# Patient Record
Sex: Male | Born: 1940 | Hispanic: Yes | Marital: Married | Smoking: Never smoker
Health system: Southern US, Community
[De-identification: ages and names within clinical notes are randomized; demographics above are authoritative.]

## PROBLEM LIST (undated history)

## (undated) DIAGNOSIS — E785 Hyperlipidemia, unspecified: Secondary | ICD-10-CM

## (undated) DIAGNOSIS — E119 Type 2 diabetes mellitus without complications: Secondary | ICD-10-CM

---

## 2018-08-06 ENCOUNTER — Encounter: Payer: Self-pay | Admitting: Cardiovascular Disease

## 2018-08-06 ENCOUNTER — Ambulatory Visit (INDEPENDENT_AMBULATORY_CARE_PROVIDER_SITE_OTHER): Payer: Medicare Other | Admitting: Cardiovascular Disease

## 2018-08-06 VITALS — BP 130/80 | HR 88 | Ht 65.0 in | Wt 186.6 lb

## 2018-08-06 DIAGNOSIS — R0789 Other chest pain: Secondary | ICD-10-CM

## 2018-08-06 DIAGNOSIS — I208 Other forms of angina pectoris: Secondary | ICD-10-CM | POA: Diagnosis not present

## 2018-08-06 DIAGNOSIS — Z01812 Encounter for preprocedural laboratory examination: Secondary | ICD-10-CM | POA: Diagnosis not present

## 2018-08-06 DIAGNOSIS — E119 Type 2 diabetes mellitus without complications: Secondary | ICD-10-CM | POA: Insufficient documentation

## 2018-08-06 DIAGNOSIS — E785 Hyperlipidemia, unspecified: Secondary | ICD-10-CM | POA: Insufficient documentation

## 2018-08-06 DIAGNOSIS — E782 Mixed hyperlipidemia: Secondary | ICD-10-CM | POA: Diagnosis not present

## 2018-08-06 MED ORDER — METOPROLOL TARTRATE 50 MG PO TABS: 50.0000 mg | ORAL_TABLET | Freq: Two times a day (BID) | ORAL | 0 refills | Status: DC

## 2018-08-06 NOTE — Assessment & Plan Note (Signed)
[name] was referred by Dr. Hanley Hays for evaluation of atypical chest pain.  His risk factors include treated hyperlipidemia and diabetes.  There is no family history.  He is never smoked.  He did have a normal 2D echo and Myoview stress test back in October Performa Dr. Hanley Hays.  He has ongoing chest pain which is nitrate responsive.  He was sent here for further evaluation.  I think we can perform coronary CTA and this is normal not proceed with an invasive cath however if this is abnormal we talked about proceeding with cardiac catheterization.

## 2018-08-06 NOTE — Patient Instructions (Addendum)
Please arrive at the [facility] main entrance of [facility] at xx:xx AM (30-45 minutes prior to test start time)  [facility] [address] [place], [place] [address] [phone]  Proceed to the [facility] Radiology Department (First Floor).  Please follow these instructions carefully (unless otherwise directed):  Hold all erectile dysfunction medications at least 48 hours prior to test.  On the Night Before the Test: . Be sure to Drink plenty of water. . Do not consume any caffeinated/decaffeinated beverages or chocolate 12 hours prior to your test. . Do not take any antihistamines 12 hours prior to your test. . If you take Metformin do not take 24 hours prior to test.  On the Day of the Test: . Drink plenty of water. Do not drink any water within one hour of the test. . Do not eat any food 4 hours prior to the test. . You may take your regular medications prior to the test.  . Take metoprolol 50 mg (Lopressor) two hours prior to test.                 -If HR is greater than 55 BPM and patient is greater than 81 yrs old Lopressor 50 mg x1.        After the Test: . Drink plenty of water. . After receiving IV contrast, you may experience a mild flushed feeling. This is normal. . On occasion, you may experience a mild rash up to 24 hours after the test. This is not dangerous. If this occurs, you can take Benadryl 25 mg and increase your fluid intake. . If you experience trouble breathing, this can be serious. If it is severe call 911 IMMEDIATELY. If it is mild, please call our office. . If you take any of these medications: Glipizide/Metformin, Avandament, Glucavance, please do not take 48 hours after completing test.

## 2018-08-06 NOTE — Progress Notes (Signed)
08/06/2018 Hunter Harvey   September 07, 1940  [id number]  Primary Physician Hunter Dodge, MD Primary Cardiologist: Hunter Gess MD Hunter Harvey, [place]  HPI:  Hunter Harvey is a 78 y.o. moderately overweight married Latino male father 3, grandfather 2 grandchildren who is accompanied by his daughter-in-law Hunter Harvey and an interpreter.  He was referred by Dr. Hanley Harvey for cardiovascular evaluation because of atypical chest pain.  He lived in [place] for 30 years and moved to [place] 2 years ago for family reasons.  His risk factors include treated hyperlipidemia and diabetes.  He is never had a heart attack or stroke.  There is no family history for heart disease.  He did have a normal 2D echo and Myoview stress test back in October but continues to have chest pain which is nitrate responsive.   Current Meds  Medication Sig  . Insulin Glargine, 1 Unit Dial, 300 UNIT/ML SOPN Inject into the skin.     Not on File  Social History   Socioeconomic History  . Marital status: Married    Spouse name: Not on file  . Number of children: Not on file  . Years of education: Not on file  . Highest education level: Not on file  Occupational History  . Not on file  Social Needs  . Financial resource strain: Not on file  . Food insecurity:    Worry: Not on file    Inability: Not on file  . Transportation needs:    Medical: Not on file    Non-medical: Not on file  Tobacco Use  . Smoking status: Never Smoker  . Smokeless tobacco: Never Used  Substance and Sexual Activity  . Alcohol use: Not on file  . Drug use: Not on file  . Sexual activity: Not on file  Lifestyle  . Physical activity:    Days per week: Not on file    Minutes per session: Not on file  . Stress: Not on file  Relationships  . Social connections:    Talks on phone: Not on file    Gets together: Not on file    Attends religious service: Not on file    Active member of club or organization: Not on  file    Attends meetings of clubs or organizations: Not on file    Relationship status: Not on file  . Intimate partner violence:    Fear of current or ex partner: Not on file    Emotionally abused: Not on file    Physically abused: Not on file    Forced sexual activity: Not on file  Other Topics Concern  . Not on file  Social History Narrative  . Not on file     Review of Systems: General: negative for chills, fever, night sweats or weight changes.  Cardiovascular: negative for chest pain, dyspnea on exertion, edema, orthopnea, palpitations, paroxysmal nocturnal dyspnea or shortness of breath Dermatological: negative for rash Respiratory: negative for cough or wheezing Urologic: negative for hematuria Abdominal: negative for nausea, vomiting, diarrhea, bright red blood per rectum, melena, or hematemesis Neurologic: negative for visual changes, syncope, or dizziness All other systems reviewed and are otherwise negative except as noted above.    Blood pressure 130/80, pulse 88, height 5\' 5"  (1.651 m), weight 186 lb 9.6 oz (84.6 kg).  General appearance: alert and no distress Neck: no adenopathy, no carotid bruit, no JVD, supple, symmetrical, trachea midline and thyroid not enlarged, symmetric, no tenderness/mass/nodules Lungs: clear  to auscultation bilaterally Heart: regular rate and rhythm, S1, S2 normal, no murmur, click, rub or gallop Extremities: extremities normal, atraumatic, no cyanosis or edema Pulses: 2+ and symmetric Skin: Skin color, texture, turgor normal. No rashes or lesions Neurologic: Alert and oriented X 3, normal strength and tone. Normal symmetric reflexes. 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  I personally reviewed this EKG.  ASSESSMENT AND PLAN:   Atypical chest pain [name] was referred by Dr. Hanley Harvey for evaluation of atypical chest pain.  His  risk factors include treated hyperlipidemia and diabetes.  There is no family history.  He is never smoked.  He did have a normal 2D echo and Myoview stress test back in October Performa Dr. Hanley Harvey.  He has ongoing chest pain which is nitrate responsive.  He was sent here for further evaluation.  I think we can perform coronary CTA and this is normal not proceed with an invasive cath however if this is abnormal we talked about proceeding with cardiac catheterization.      Hunter Gess MD FACP,FACC,FAHA, [facility] 08/06/2018 10:26 AM

## 2018-09-01 ENCOUNTER — Ambulatory Visit: Payer: Medicare Other | Admitting: Cardiovascular Disease

## 2018-10-18 ENCOUNTER — Other Ambulatory Visit: Payer: Self-pay | Admitting: Cardiovascular Disease

## 2018-10-20 ENCOUNTER — Telehealth: Payer: Self-pay | Admitting: Cardiovascular Disease

## 2018-10-20 NOTE — Telephone Encounter (Signed)
Left message through  the interpreter to call to and schedule cardaic ct

## 2018-11-14 ENCOUNTER — Other Ambulatory Visit: Payer: Self-pay

## 2018-11-14 MED ORDER — METOPROLOL TARTRATE 50 MG PO TABS: 50.0000 mg | ORAL_TABLET | Freq: Once | ORAL | 0 refills | Status: AC

## 2018-11-14 MED ORDER — METOPROLOL TARTRATE 50 MG PO TABS: 50.0000 mg | ORAL_TABLET | Freq: Once | ORAL | 0 refills | Status: DC

## 2018-12-01 NOTE — Addendum Note (Signed)
Addended by: Annita Brod on: 12/01/2018 06:01 PM   Modules accepted: Orders

## 2018-12-15 ENCOUNTER — Telehealth: Payer: Self-pay

## 2018-12-15 NOTE — Telephone Encounter (Signed)
[facility]MM on [phone] and [phone] regarding pt need for lab work on 7/16 prior to coronary CTA on 7/17

## 2018-12-15 NOTE — Addendum Note (Signed)
Addended by: Annita Brod on: 12/15/2018 05:18 PM   Modules accepted: Orders

## 2018-12-16 NOTE — Telephone Encounter (Signed)
New message:     Patient daughter-in-law calling concering patient up coming tomorrow they do not want this appt. Please call patient.

## 2018-12-16 NOTE — Telephone Encounter (Signed)
LM2CB x2  

## 2018-12-17 ENCOUNTER — Ambulatory Visit (HOSPITAL_COMMUNITY): Payer: Medicare Other

## 2018-12-17 NOTE — Telephone Encounter (Signed)
Patient ct scan has been canceled.

## 2019-10-31 ENCOUNTER — Emergency Department (HOSPITAL_COMMUNITY): Payer: Medicare Other

## 2019-10-31 ENCOUNTER — Other Ambulatory Visit: Payer: Self-pay

## 2019-10-31 ENCOUNTER — Inpatient Hospital Stay (HOSPITAL_COMMUNITY)
Admission: EM | Admit: 2019-10-31 | Discharge: 2019-11-05 | DRG: 208 | Disposition: A | Discharge status: Home / Self Care | Payer: Medicare Other | Attending: Pulmonary Disease | Admitting: Pulmonary Disease

## 2019-10-31 ENCOUNTER — Inpatient Hospital Stay (HOSPITAL_COMMUNITY): Payer: Medicare Other

## 2019-10-31 ENCOUNTER — Encounter (HOSPITAL_COMMUNITY): Payer: Self-pay | Admitting: Critical Care Medicine

## 2019-10-31 DIAGNOSIS — J9600 Acute respiratory failure, unspecified whether with hypoxia or hypercapnia: Secondary | ICD-10-CM | POA: Diagnosis present

## 2019-10-31 DIAGNOSIS — J8489 Other specified interstitial pulmonary diseases: Secondary | ICD-10-CM | POA: Diagnosis present

## 2019-10-31 DIAGNOSIS — R34 Anuria and oliguria: Secondary | ICD-10-CM | POA: Diagnosis not present

## 2019-10-31 DIAGNOSIS — J8410 Pulmonary fibrosis, unspecified: Secondary | ICD-10-CM | POA: Diagnosis present

## 2019-10-31 DIAGNOSIS — J9621 Acute and chronic respiratory failure with hypoxia: Principal | ICD-10-CM | POA: Diagnosis present

## 2019-10-31 DIAGNOSIS — E11649 Type 2 diabetes mellitus with hypoglycemia without coma: Secondary | ICD-10-CM | POA: Diagnosis not present

## 2019-10-31 DIAGNOSIS — Z7982 Long term (current) use of aspirin: Secondary | ICD-10-CM

## 2019-10-31 DIAGNOSIS — N179 Acute kidney failure, unspecified: Secondary | ICD-10-CM | POA: Diagnosis present

## 2019-10-31 DIAGNOSIS — J9622 Acute and chronic respiratory failure with hypercapnia: Secondary | ICD-10-CM | POA: Diagnosis present

## 2019-10-31 DIAGNOSIS — E11319 Type 2 diabetes mellitus with unspecified diabetic retinopathy without macular edema: Secondary | ICD-10-CM | POA: Diagnosis present

## 2019-10-31 DIAGNOSIS — F419 Anxiety disorder, unspecified: Secondary | ICD-10-CM | POA: Diagnosis not present

## 2019-10-31 DIAGNOSIS — Z515 Encounter for palliative care: Secondary | ICD-10-CM

## 2019-10-31 DIAGNOSIS — M069 Rheumatoid arthritis, unspecified: Secondary | ICD-10-CM | POA: Diagnosis present

## 2019-10-31 DIAGNOSIS — Z794 Long term (current) use of insulin: Secondary | ICD-10-CM

## 2019-10-31 DIAGNOSIS — J9602 Acute respiratory failure with hypercapnia: Secondary | ICD-10-CM

## 2019-10-31 DIAGNOSIS — Z66 Do not resuscitate: Secondary | ICD-10-CM | POA: Diagnosis present

## 2019-10-31 DIAGNOSIS — D539 Nutritional anemia, unspecified: Secondary | ICD-10-CM | POA: Diagnosis present

## 2019-10-31 DIAGNOSIS — E872 Acidosis: Secondary | ICD-10-CM | POA: Diagnosis present

## 2019-10-31 DIAGNOSIS — E1121 Type 2 diabetes mellitus with diabetic nephropathy: Secondary | ICD-10-CM | POA: Diagnosis present

## 2019-10-31 DIAGNOSIS — J189 Pneumonia, unspecified organism: Secondary | ICD-10-CM | POA: Diagnosis present

## 2019-10-31 DIAGNOSIS — E785 Hyperlipidemia, unspecified: Secondary | ICD-10-CM | POA: Diagnosis present

## 2019-10-31 DIAGNOSIS — J811 Chronic pulmonary edema: Secondary | ICD-10-CM | POA: Diagnosis present

## 2019-10-31 DIAGNOSIS — G9341 Metabolic encephalopathy: Secondary | ICD-10-CM | POA: Diagnosis present

## 2019-10-31 DIAGNOSIS — Z20822 Contact with and (suspected) exposure to covid-19: Secondary | ICD-10-CM | POA: Diagnosis present

## 2019-10-31 DIAGNOSIS — I959 Hypotension, unspecified: Secondary | ICD-10-CM | POA: Diagnosis not present

## 2019-10-31 DIAGNOSIS — R7402 Elevation of levels of lactic acid dehydrogenase (LDH): Secondary | ICD-10-CM | POA: Diagnosis present

## 2019-10-31 DIAGNOSIS — E875 Hyperkalemia: Secondary | ICD-10-CM | POA: Diagnosis present

## 2019-10-31 DIAGNOSIS — H903 Sensorineural hearing loss, bilateral: Secondary | ICD-10-CM | POA: Diagnosis present

## 2019-10-31 DIAGNOSIS — Z79899 Other long term (current) drug therapy: Secondary | ICD-10-CM

## 2019-10-31 DIAGNOSIS — J849 Interstitial pulmonary disease, unspecified: Secondary | ICD-10-CM

## 2019-10-31 DIAGNOSIS — Z9981 Dependence on supplemental oxygen: Secondary | ICD-10-CM

## 2019-10-31 DIAGNOSIS — E871 Hypo-osmolality and hyponatremia: Secondary | ICD-10-CM | POA: Diagnosis present

## 2019-10-31 HISTORY — DX: Hyperlipidemia, unspecified: E78.5

## 2019-10-31 HISTORY — DX: Type 2 diabetes mellitus without complications: E11.9

## 2019-10-31 LAB — COMPREHENSIVE METABOLIC PANEL
ALT: 17 U/L (ref 0–44)
AST: 25 U/L (ref 15–41)
Albumin: 2.9 g/dL — ABNORMAL LOW (ref 3.5–5.0)
Alkaline Phosphatase: 86 U/L (ref 38–126)
Anion gap: 9 (ref 5–15)
BUN: 37 mg/dL — ABNORMAL HIGH (ref 8–23)
CO2: 30 mmol/L (ref 22–32)
Calcium: 8.2 mg/dL — ABNORMAL LOW (ref 8.9–10.3)
Chloride: 86 mmol/L — ABNORMAL LOW (ref 98–111)
Creatinine, Ser: 1.5 mg/dL — ABNORMAL HIGH (ref 0.61–1.24)
GFR calc Af Amer: 51 mL/min — ABNORMAL LOW (ref >60)
GFR calc non Af Amer: 44 mL/min — ABNORMAL LOW (ref >60)
Glucose, Bld: 221 mg/dL — ABNORMAL HIGH (ref 70–99)
Potassium: >7.5 mmol/L (ref 3.5–5.1)
Sodium: 125 mmol/L — ABNORMAL LOW (ref 135–145)
Total Bilirubin: 0.3 mg/dL (ref 0.3–1.2)
Total Protein: 7.8 g/dL (ref 6.5–8.1)

## 2019-10-31 LAB — CBC WITH DIFFERENTIAL/PLATELET
Abs Immature Granulocytes: 0.2 10*3/uL — ABNORMAL HIGH (ref 0.00–0.07)
Basophils Absolute: 0 10*3/uL (ref 0.0–0.1)
Basophils Relative: 0 %
Eosinophils Absolute: 0 10*3/uL (ref 0.0–0.5)
Eosinophils Relative: 0 %
HCT: 34.5 % — ABNORMAL LOW (ref 39.0–52.0)
Hemoglobin: 10.7 g/dL — ABNORMAL LOW (ref 13.0–17.0)
Immature Granulocytes: 2 %
Lymphocytes Relative: 8 %
Lymphs Abs: 1.1 10*3/uL (ref 0.7–4.0)
MCH: 29.8 pg (ref 26.0–34.0)
MCHC: 31 g/dL (ref 30.0–36.0)
MCV: 96.1 fL (ref 80.0–100.0)
Monocytes Absolute: 1.1 10*3/uL — ABNORMAL HIGH (ref 0.1–1.0)
Monocytes Relative: 8 %
Neutro Abs: 11 10*3/uL — ABNORMAL HIGH (ref 1.7–7.7)
Neutrophils Relative %: 82 %
Platelets: 275 10*3/uL (ref 150–400)
RBC: 3.59 MIL/uL — ABNORMAL LOW (ref 4.22–5.81)
RDW: 14 % (ref 11.5–15.5)
WBC: 13.5 10*3/uL — ABNORMAL HIGH (ref 4.0–10.5)
nRBC: 0 % (ref 0.0–0.2)

## 2019-10-31 LAB — BASIC METABOLIC PANEL
Anion gap: 4 — ABNORMAL LOW (ref 5–15)
Anion gap: 8 (ref 5–15)
BUN: 40 mg/dL — ABNORMAL HIGH (ref 8–23)
BUN: 41 mg/dL — ABNORMAL HIGH (ref 8–23)
CO2: 31 mmol/L (ref 22–32)
CO2: 28 mmol/L (ref 22–32)
Calcium: 8 mg/dL — ABNORMAL LOW (ref 8.9–10.3)
Calcium: 8 mg/dL — ABNORMAL LOW (ref 8.9–10.3)
Chloride: 90 mmol/L — ABNORMAL LOW (ref 98–111)
Chloride: 89 mmol/L — ABNORMAL LOW (ref 98–111)
Creatinine, Ser: 1.42 mg/dL — ABNORMAL HIGH (ref 0.61–1.24)
Creatinine, Ser: 1.5 mg/dL — ABNORMAL HIGH (ref 0.61–1.24)
GFR calc Af Amer: 54 mL/min — ABNORMAL LOW (ref >60)
GFR calc Af Amer: 51 mL/min — ABNORMAL LOW (ref >60)
GFR calc non Af Amer: 47 mL/min — ABNORMAL LOW (ref >60)
GFR calc non Af Amer: 44 mL/min — ABNORMAL LOW (ref >60)
Glucose, Bld: 275 mg/dL — ABNORMAL HIGH (ref 70–99)
Glucose, Bld: 264 mg/dL — ABNORMAL HIGH (ref 70–99)
Potassium: 5.5 mmol/L — ABNORMAL HIGH (ref 3.5–5.1)
Potassium: 5.6 mmol/L — ABNORMAL HIGH (ref 3.5–5.1)
Sodium: 125 mmol/L — ABNORMAL LOW (ref 135–145)
Sodium: 125 mmol/L — ABNORMAL LOW (ref 135–145)

## 2019-10-31 LAB — POCT I-STAT EG7
Acid-Base Excess: 4 mmol/L — ABNORMAL HIGH (ref 0.0–2.0)
Bicarbonate: 34.8 mmol/L — ABNORMAL HIGH (ref 20.0–28.0)
Calcium, Ion: 1.1 mmol/L — ABNORMAL LOW (ref 1.15–1.40)
HCT: 34 % — ABNORMAL LOW (ref 39.0–52.0)
Hemoglobin: 11.6 g/dL — ABNORMAL LOW (ref 13.0–17.0)
O2 Saturation: 96 %
Potassium: 5.7 mmol/L — ABNORMAL HIGH (ref 3.5–5.1)
Sodium: 127 mmol/L — ABNORMAL LOW (ref 135–145)
TCO2: 38 mmol/L — ABNORMAL HIGH (ref 22–32)
pCO2, Ven: 92.5 mmHg (ref 44.0–60.0)
pH, Ven: 7.183 — CL (ref 7.250–7.430)
pO2, Ven: 107 mmHg — ABNORMAL HIGH (ref 32.0–45.0)

## 2019-10-31 LAB — CBG MONITORING, ED: Glucose-Capillary: 228 mg/dL — ABNORMAL HIGH (ref 70–99)

## 2019-10-31 LAB — CBC
HCT: 31.6 % — ABNORMAL LOW (ref 39.0–52.0)
Hemoglobin: 9.9 g/dL — ABNORMAL LOW (ref 13.0–17.0)
MCH: 30.3 pg (ref 26.0–34.0)
MCHC: 31.3 g/dL (ref 30.0–36.0)
MCV: 96.6 fL (ref 80.0–100.0)
Platelets: 246 10*3/uL (ref 150–400)
RBC: 3.27 MIL/uL — ABNORMAL LOW (ref 4.22–5.81)
RDW: 14 % (ref 11.5–15.5)
WBC: 9.8 10*3/uL (ref 4.0–10.5)
nRBC: 0 % (ref 0.0–0.2)

## 2019-10-31 LAB — CREATININE, SERUM
Creatinine, Ser: 1.49 mg/dL — ABNORMAL HIGH (ref 0.61–1.24)
GFR calc Af Amer: 51 mL/min — ABNORMAL LOW (ref >60)
GFR calc non Af Amer: 44 mL/min — ABNORMAL LOW (ref >60)

## 2019-10-31 LAB — POCT I-STAT 7, (LYTES, BLD GAS, ICA,H+H)
Acid-Base Excess: 4 mmol/L — ABNORMAL HIGH (ref 0.0–2.0)
Acid-Base Excess: 8 mmol/L — ABNORMAL HIGH (ref 0.0–2.0)
Bicarbonate: 34.7 mmol/L — ABNORMAL HIGH (ref 20.0–28.0)
Bicarbonate: 35.8 mmol/L — ABNORMAL HIGH (ref 20.0–28.0)
Calcium, Ion: 1.18 mmol/L (ref 1.15–1.40)
Calcium, Ion: 1.14 mmol/L — ABNORMAL LOW (ref 1.15–1.40)
HCT: 35 % — ABNORMAL LOW (ref 39.0–52.0)
HCT: 31 % — ABNORMAL LOW (ref 39.0–52.0)
Hemoglobin: 11.9 g/dL — ABNORMAL LOW (ref 13.0–17.0)
Hemoglobin: 10.5 g/dL — ABNORMAL LOW (ref 13.0–17.0)
O2 Saturation: 91 %
O2 Saturation: 100 %
Patient temperature: 98
Patient temperature: 98
Potassium: 6.3 mmol/L (ref 3.5–5.1)
Potassium: 5.5 mmol/L — ABNORMAL HIGH (ref 3.5–5.1)
Sodium: 124 mmol/L — ABNORMAL LOW (ref 135–145)
Sodium: 126 mmol/L — ABNORMAL LOW (ref 135–145)
TCO2: 37 mmol/L — ABNORMAL HIGH (ref 22–32)
TCO2: 38 mmol/L — ABNORMAL HIGH (ref 22–32)
pCO2 arterial: 89.2 mmHg (ref 32.0–48.0)
pCO2 arterial: 68.7 mmHg (ref 32.0–48.0)
pH, Arterial: 7.196 — CL (ref 7.350–7.450)
pH, Arterial: 7.323 — ABNORMAL LOW (ref 7.350–7.450)
pO2, Arterial: 78 mmHg — ABNORMAL LOW (ref 83.0–108.0)
pO2, Arterial: 507 mmHg — ABNORMAL HIGH (ref 83.0–108.0)

## 2019-10-31 LAB — HEMOGLOBIN A1C
Hgb A1c MFr Bld: 7.9 % — ABNORMAL HIGH (ref 4.8–5.6)
Mean Plasma Glucose: 180.03 mg/dL

## 2019-10-31 LAB — RETICULOCYTES
Immature Retic Fract: 29.8 % — ABNORMAL HIGH (ref 2.3–15.9)
RBC.: 3.28 MIL/uL — ABNORMAL LOW (ref 4.22–5.81)
Retic Count, Absolute: 100.4 10*3/uL (ref 19.0–186.0)
Retic Ct Pct: 3.1 % (ref 0.4–3.1)

## 2019-10-31 LAB — LACTATE DEHYDROGENASE: LDH: 205 U/L — ABNORMAL HIGH (ref 98–192)

## 2019-10-31 LAB — IRON AND TIBC
Iron: 35 ug/dL — ABNORMAL LOW (ref 45–182)
Saturation Ratios: 13 % — ABNORMAL LOW (ref 17.9–39.5)
TIBC: 265 ug/dL (ref 250–450)
UIBC: 230 ug/dL

## 2019-10-31 LAB — FERRITIN: Ferritin: 111 ng/mL (ref 24–336)

## 2019-10-31 LAB — BILIRUBIN, TOTAL: Total Bilirubin: 1.1 mg/dL (ref 0.3–1.2)

## 2019-10-31 LAB — BILIRUBIN, DIRECT: Bilirubin, Direct: 0.3 mg/dL — ABNORMAL HIGH (ref 0.0–0.2)

## 2019-10-31 LAB — BRAIN NATRIURETIC PEPTIDE: B Natriuretic Peptide: 311.9 pg/mL — ABNORMAL HIGH (ref 0.0–100.0)

## 2019-10-31 LAB — FOLATE: Folate: 9 ng/mL (ref >5.9)

## 2019-10-31 LAB — VITAMIN B12: Vitamin B-12: 208 pg/mL (ref 180–914)

## 2019-10-31 LAB — TROPONIN I (HIGH SENSITIVITY)
Troponin I (High Sensitivity): 10 ng/L (ref <18)
Troponin I (High Sensitivity): 10 ng/L (ref <18)

## 2019-10-31 LAB — ETHANOL: Alcohol, Ethyl (B): <10 mg/dL (ref <10)

## 2019-10-31 LAB — SARS CORONAVIRUS 2 BY RT PCR (HOSPITAL ORDER, PERFORMED IN ~~LOC~~ HOSPITAL LAB): SARS Coronavirus 2: NEGATIVE

## 2019-10-31 LAB — POC SARS CORONAVIRUS 2 AG -  ED: SARS Coronavirus 2 Ag: NEGATIVE

## 2019-10-31 MED ORDER — SODIUM BICARBONATE 8.4 % IV SOLN
50.0000 meq | Freq: Once | INTRAVENOUS | Status: AC
  Administered 2019-10-31: 50 meq via INTRAVENOUS
  Filled 2019-10-31: qty 50

## 2019-10-31 MED ORDER — FENTANYL CITRATE (PF) 100 MCG/2ML IJ SOLN: 25.0000 ug | INTRAMUSCULAR | Status: DC | PRN

## 2019-10-31 MED ORDER — IPRATROPIUM-ALBUTEROL 0.5-2.5 (3) MG/3ML IN SOLN
3.0000 mL | Freq: Once | RESPIRATORY_TRACT | Status: AC
  Administered 2019-10-31: 3 mL via RESPIRATORY_TRACT

## 2019-10-31 MED ORDER — PANTOPRAZOLE SODIUM 40 MG IV SOLR
40.0000 mg | Freq: Every day | INTRAVENOUS | Status: DC
  Administered 2019-11-01 – 2019-11-02 (×2): 40 mg via INTRAVENOUS
  Filled 2019-10-31 (×2): qty 40

## 2019-10-31 MED ORDER — CALCIUM GLUCONATE 10 % IV SOLN
1.0000 g | Freq: Once | INTRAVENOUS | Status: AC
  Administered 2019-10-31: 1 g via INTRAVENOUS
  Filled 2019-10-31: qty 10

## 2019-10-31 MED ORDER — SODIUM CHLORIDE 0.9 % IV SOLN
500.0000 mg | Freq: Once | INTRAVENOUS | Status: AC
  Administered 2019-10-31: 500 mg via INTRAVENOUS
  Filled 2019-10-31: qty 500

## 2019-10-31 MED ORDER — IPRATROPIUM-ALBUTEROL 0.5-2.5 (3) MG/3ML IN SOLN
RESPIRATORY_TRACT | Status: AC
  Filled 2019-10-31: qty 3

## 2019-10-31 MED ORDER — SODIUM CHLORIDE 0.9 % IV SOLN
1.0000 g | Freq: Once | INTRAVENOUS | Status: AC
  Administered 2019-10-31: 1 g via INTRAVENOUS
  Filled 2019-10-31: qty 10

## 2019-10-31 MED ORDER — PROPOFOL 1000 MG/100ML IV EMUL
0.0000 ug/kg/min | INTRAVENOUS | Status: DC
  Administered 2019-10-31: 20.425 ug/kg/min via INTRAVENOUS
  Administered 2019-11-01: 35 ug/kg/min via INTRAVENOUS
  Administered 2019-11-01: 45 ug/kg/min via INTRAVENOUS
  Administered 2019-11-01: 10 ug/kg/min via INTRAVENOUS
  Administered 2019-11-02: 15 ug/kg/min via INTRAVENOUS
  Filled 2019-10-31 (×3): qty 100
  Filled 2019-10-31: qty 200

## 2019-10-31 MED ORDER — ETOMIDATE 2 MG/ML IV SOLN: 20.0000 mg | Freq: Once | INTRAVENOUS | Status: DC

## 2019-10-31 MED ORDER — INSULIN ASPART 100 UNIT/ML IV SOLN
10.0000 [IU] | Freq: Once | INTRAVENOUS | Status: AC
  Administered 2019-10-31: 10 [IU] via INTRAVENOUS

## 2019-10-31 MED ORDER — HEPARIN SODIUM (PORCINE) 5000 UNIT/ML IJ SOLN
5000.0000 [IU] | Freq: Three times a day (TID) | INTRAMUSCULAR | Status: DC
  Administered 2019-11-01 – 2019-11-05 (×14): 5000 [IU] via SUBCUTANEOUS
  Filled 2019-10-31 (×14): qty 1

## 2019-10-31 MED ORDER — LORAZEPAM 2 MG/ML IJ SOLN
0.5000 mg | Freq: Once | INTRAMUSCULAR | Status: AC
  Administered 2019-10-31: 0.5 mg via INTRAVENOUS
  Filled 2019-10-31: qty 1

## 2019-10-31 MED ORDER — SODIUM CHLORIDE 0.9 % IV SOLN
2.0000 g | INTRAVENOUS | Status: DC
  Administered 2019-11-01 – 2019-11-04 (×4): 2 g via INTRAVENOUS
  Filled 2019-10-31 (×2): qty 2
  Filled 2019-10-31: qty 20
  Filled 2019-10-31 (×2): qty 2

## 2019-10-31 MED ORDER — ALBUTEROL (5 MG/ML) CONTINUOUS INHALATION SOLN
15.0000 mg/h | INHALATION_SOLUTION | RESPIRATORY_TRACT | Status: DC
  Administered 2019-10-31: 15 mg/h via RESPIRATORY_TRACT
  Filled 2019-10-31 (×2): qty 20

## 2019-10-31 MED ORDER — SODIUM CHLORIDE 0.9 % IV SOLN
500.0000 mg | INTRAVENOUS | Status: DC
  Administered 2019-11-01 – 2019-11-04 (×4): 500 mg via INTRAVENOUS
  Filled 2019-10-31 (×4): qty 500

## 2019-10-31 MED ORDER — ROCURONIUM BROMIDE 50 MG/5ML IV SOLN
INTRAVENOUS | Status: AC | PRN
  Administered 2019-10-31: 100 mg via INTRAVENOUS

## 2019-10-31 MED ORDER — POLYETHYLENE GLYCOL 3350 17 G PO PACK
17.0000 g | PACK | Freq: Every day | ORAL | Status: DC
  Filled 2019-10-31: qty 1

## 2019-10-31 MED ORDER — ROCURONIUM BROMIDE 50 MG/5ML IV SOLN
80.0000 mg | Freq: Once | INTRAVENOUS | Status: DC
  Filled 2019-10-31: qty 8

## 2019-10-31 MED ORDER — DOCUSATE SODIUM 50 MG/5ML PO LIQD
100.0000 mg | Freq: Two times a day (BID) | ORAL | Status: DC
  Filled 2019-10-31 (×2): qty 10

## 2019-10-31 MED ORDER — ETOMIDATE 2 MG/ML IV SOLN
INTRAVENOUS | Status: AC | PRN
  Administered 2019-10-31: 20 mg via INTRAVENOUS

## 2019-10-31 MED ORDER — METHYLPREDNISOLONE SODIUM SUCC 125 MG IJ SOLR
125.0000 mg | Freq: Once | INTRAMUSCULAR | Status: AC
  Administered 2019-10-31: 125 mg via INTRAVENOUS
  Filled 2019-10-31: qty 2

## 2019-10-31 MED ORDER — DEXTROSE 50 % IV SOLN
1.0000 | Freq: Once | INTRAVENOUS | Status: AC
  Administered 2019-10-31: 50 mL via INTRAVENOUS
  Filled 2019-10-31: qty 50

## 2019-10-31 MED ORDER — INSULIN ASPART 100 UNIT/ML ~~LOC~~ SOLN
0.0000 [IU] | SUBCUTANEOUS | Status: DC
  Administered 2019-11-01: 3 [IU] via SUBCUTANEOUS
  Administered 2019-11-01: 8 [IU] via SUBCUTANEOUS
  Administered 2019-11-01: 5 [IU] via SUBCUTANEOUS
  Administered 2019-11-02: 2 [IU] via SUBCUTANEOUS
  Administered 2019-11-03: 3 [IU] via SUBCUTANEOUS
  Administered 2019-11-03: 2 [IU] via SUBCUTANEOUS
  Administered 2019-11-04: 8 [IU] via SUBCUTANEOUS
  Administered 2019-11-04: 2 [IU] via SUBCUTANEOUS
  Administered 2019-11-04: 3 [IU] via SUBCUTANEOUS
  Administered 2019-11-04: 2 [IU] via SUBCUTANEOUS
  Administered 2019-11-05: 3 [IU] via SUBCUTANEOUS
  Administered 2019-11-05: 2 [IU] via SUBCUTANEOUS

## 2019-10-31 NOTE — Progress Notes (Signed)
Ventilator setting changed per MD order VT 530 rate 16.  RT will obtain a ABG  in 1 hour.

## 2019-10-31 NOTE — ED Notes (Signed)
BiPAP determined to be insufficient intervention for pt. Drs. Kohut and Landsee at bedside to intubate. RT notified, pharmacy at bedside.

## 2019-10-31 NOTE — ED Notes (Signed)
I Stat VBG result reported to Dr. Michel Harrow.

## 2019-10-31 NOTE — H&P (Signed)
NAME:  Hunter Harvey, MRN:  [id number], DOB:  [birth year], LOS: 0 ADMISSION DATE:  10/31/2019, CONSULTATION DATE: 5/31 REFERRING MD: Dr. Juleen China, CHIEF COMPLAINT: Shortness of breath  Brief History   79 year old male with interstitial lung disease admitted 5/31 with acute on chronic hypoxemic and hypercarbic respiratory failure requiring intubation.   History of present illness   Patient is encephalopathic and/or intubated. Therefore history has been obtained from chart review.  79 year old male with PMH significant for DM, interstitial lung disease, rheumatoid arthritis, hearing loss, and recurrent pulmonary infections. He wears 2L of oxygen at home. He was in his usual state of health until 5/31 when he developed progressive shortness of breath and ultimately altered sensorium, which prompted his presentation to [facility] ED. Immediately he was found to be hypoxemic and was escalated to 15L NRB with improvement of his O2 sats. He was eventually initiated on BiPAP. Imaging showed diffuse lung parenchymal abnormality. Laboratory evaluation significant for Na 125, K 7.5 (temporized in ED with improvement to 5.5), Glucose 221, Creatinine 1.5, WBC 13.5 and Hgb 10.7.   Significant Hospital Events   5/31 admit  Consults:    Procedures:  ETT 5/31 >  Significant Diagnostic Tests:  CT chest 5/31 >  Micro Data:  Blood 5/31 > Tracheal aspirate 5/31 >  Antimicrobials:  Ceftriaxone 5/31 > Azithromycin 5/31 >  Interim history/subjective:    Objective   Blood pressure 140/70, pulse (!) 103, temperature 98 F (36.7 C), temperature source Oral, resp. rate (!) 26, weight 81.6 kg, SpO2 96 %.    FiO2 (%):  [40 %] 40 %   Intake/Output Summary (Last 24 hours) at 10/31/2019 2054 Last data filed at 10/31/2019 1833 Gross per 24 hour  Intake 357.98 ml  Output --  Net 357.98 ml   Filed Weights   10/31/19 2015  Weight: 81.6 kg    Examination: General: Overweight elderly male on vent HENT:  [place]/AT, PERRL, no JVD Lungs: Diffuse rales Cardiovascular: RRR, no MRG Abdomen: Soft, non-distended Extremities: No acute deformity. 1+ edema to ankle of BLE Neuro: Sedated post intubation.   Resolved Hospital Problem list     Assessment & Plan:  Acute on chronic hypoxemic respiratory failure: Acute hypercarbic respiratory failure:  He has history of ILD, felt possibly secondary to RA vs NSIP based on notes in care everywhere. Given heraing loss and anemia consider CVID. Pulmonary edema also a compounding factore. He also has history of recurrent pulmonary infections including moraxella.  - Full vent support - Follow ABG - Empiric CAP coverage - Cultures - Diuresis as tolerated (40mg  lasix given in ED) - Send IgG, IgA, IgM, and beta-microglobulin. Check anemia panel, ldh, bilirubin, haptiglobin.  - Echo - CT pending  AKI Hyponatremia Hyperkalemia - Follow BMP - Diurese as tolerated  DM - CBG monitoring and SSI - NPO - Hold home metformin  Hypotension - Hold home metoprolol  Best practice:  Diet: NPO Pain/Anxiety/Delirium protocol (if indicated): Propofol, PRN fentanyl. RASS goal -2 VAP protocol (if indicated): per protocol DVT prophylaxis: sq heparin GI prophylaxis: PPI Glucose control: SSI Mobility: BR Code Status: FULL Family Communication: Daughter updated Disposition: ICU  Labs   CBC: Recent Labs  Lab 10/31/19 1503 10/31/19 1534 10/31/19 1934  WBC 13.5*  --   --   NEUTROABS 11.0*  --   --   HGB 10.7* 11.9* 11.6*  HCT 34.5* 35.0* 34.0*  MCV 96.1  --   --   PLT 275  --   --  Basic Metabolic Panel: Recent Labs  Lab 10/31/19 1503 10/31/19 1534 10/31/19 1839 10/31/19 1934  NA 125* 124* 125* 127*  K >7.5* 6.3* 5.5* 5.7*  CL 86*  --  90*  --   CO2 30  --  31  --   GLUCOSE 221*  --  275*  --   BUN 37*  --  40*  --   CREATININE 1.50*  --  1.42*  --   CALCIUM 8.2*  --  8.0*  --    GFR: CrCl cannot be calculated (Unknown ideal  weight.). Recent Labs  Lab 10/31/19 1503  WBC 13.5*    Liver Function Tests: Recent Labs  Lab 10/31/19 1503  AST 25  ALT 17  ALKPHOS 86  BILITOT 0.3  PROT 7.8  ALBUMIN 2.9*   No results for input(s): LIPASE, AMYLASE in the last 168 hours. No results for input(s): AMMONIA in the last 168 hours.  ABG    Component Value Date/Time   PHART 7.196 (LL) 10/31/2019 1534   PCO2ART 89.2 (HH) 10/31/2019 1534   PO2ART 78 (L) 10/31/2019 1534   HCO3 34.8 (H) 10/31/2019 1934   TCO2 38 (H) 10/31/2019 1934   O2SAT 96.0 10/31/2019 1934     Coagulation Profile: No results for input(s): INR, PROTIME in the last 168 hours.  Cardiac Enzymes: No results for input(s): CKTOTAL, CKMB, CKMBINDEX, TROPONINI in the last 168 hours.  HbA1C: No results found for: HGBA1C  CBG: Recent Labs  Lab 10/31/19 1922  GLUCAP 228*    Review of Systems:   Patient is encephalopathic and/or intubated. Therefore history has been obtained from chart review.    Past Medical History  He,  has no past medical history on file.   Surgical History   No past surgical history on file.   Social History   reports that he has never smoked. He has never used smokeless tobacco.   Family History   His family history is not on file.   Allergies Not on File   Home Medications  Prior to Admission medications   Medication Sig Start Date End Date Taking? Authorizing Provider  albuterol (PROVENTIL HFA;VENTOLIN HFA) 108 (90 Base) MCG/ACT inhaler Inhale into the lungs.    [provider]  aspirin 81 MG chewable tablet Chew by mouth.    [provider]  atorvastatin (LIPITOR) 20 MG tablet Take by mouth.    [provider]  docusate sodium (COLACE) 100 MG capsule Take by mouth.    [provider]  Insulin Glargine, 1 Unit Dial, 300 UNIT/ML SOPN Inject into the skin. 07/10/17   [provider]  metFORMIN (GLUCOPHAGE) 1000 MG tablet Take by mouth.    [provider]   metoprolol tartrate (LOPRESSOR) 50 MG tablet Take 1 tablet (50 mg total) by mouth once for 1 dose. TAKE 2 HOURS PRIOR TO YOUR CORONARY CTA 11/14/18 11/14/18  Lorretta Harp, MD     Critical care time: 50 mins     Georgann Housekeeper, AGACNP-BC [place] for personal pager PCCM on call pager [phone]  10/31/2019 9:38 PM

## 2019-10-31 NOTE — ED Provider Notes (Signed)
[facility] EMERGENCY DEPARTMENT Provider Note   CSN: [id number] Arrival date & time: 10/31/19  1408     History Chief Complaint  Patient presents with  . Shortness of Breath    Hunter Harvey is a 79 y.o. male.  Patient is 79 year old male presents the ED with respiratory failure, hypoxia from home.  Patient has a history interstitial lung disease, on home oxygen of 2 to 5 L at baseline.  Family states that he is worse today.  The history is provided by the patient, medical records and a relative. The history is limited by a language barrier and the condition of the patient. A language interpreter was used.  Illness Location:  Respiratory Quality:  Shortness of breath Severity:  Moderate Onset quality:  Gradual Timing:  Constant Progression:  Worsening Chronicity:  Chronic Context:  Patient has chronic interstitial lung disease, gradual worsening over the last several days Relieved by:  Nasal cannula oxygen increase Worsened by:  Nothing Ineffective treatments:  Nothing Associated symptoms: fatigue, shortness of breath and wheezing   Associated symptoms: no abdominal pain, no chest pain, no fever, no headaches, no loss of consciousness, no myalgias, no nausea and no vomiting        Past Medical History:  Diagnosis Date  . Diabetes ([place])   . Hyperlipidemia     Patient Active Problem List   Diagnosis Date Noted  . Acute respiratory failure ([place]) 10/31/2019  . Hyperkalemia   . ILD (interstitial lung disease) ([place])   . Acute on chronic respiratory failure with hypoxia and hypercapnia ([facility])   . Rheumatoid arthritis ([place])   . Bilateral sensorineural hearing loss   . Macrocytic anemia   . AKI (acute kidney injury) ([place])   . Atypical chest pain 08/06/2018  . Diabetes mellitus ([place]) 08/06/2018  . Hyperlipidemia 08/06/2018    History reviewed. No pertinent surgical history.     History reviewed. No pertinent family history.  Social History    Tobacco Use  . Smoking status: Never Smoker  . Smokeless tobacco: Never Used  Substance Use Topics  . Alcohol use: Not Currently  . Drug use: Never    Home Medications Prior to Admission medications   Medication Sig Start Date End Date Taking? Authorizing Provider  isosorbide dinitrate (ISORDIL) 30 MG tablet Take 30 mg by mouth 4 (four) times daily.   Yes [provider]  isosorbide mononitrate (IMDUR) 30 MG 24 hr tablet Take 30 mg by mouth daily.   Yes [provider]  omeprazole (PRILOSEC) 40 MG capsule Take 40 mg by mouth daily.   Yes [provider]  sulfaSALAzine (AZULFIDINE) 500 MG tablet Take 500 mg by mouth 3 (three) times daily.   Yes [provider]  tamsulosin (FLOMAX) 0.4 MG CAPS capsule Take 0.4 mg by mouth daily.   Yes [provider]  albuterol (PROVENTIL HFA;VENTOLIN HFA) 108 (90 Base) MCG/ACT inhaler Inhale into the lungs.    [provider]  aspirin 81 MG chewable tablet Chew by mouth.    [provider]  atorvastatin (LIPITOR) 20 MG tablet Take by mouth.    [provider]  Insulin Glargine, 1 Unit Dial, 300 UNIT/ML SOPN Inject into the skin. 07/10/17   [provider]  metFORMIN (GLUCOPHAGE) 1000 MG tablet Take by mouth.    [provider]  metoprolol tartrate (LOPRESSOR) 50 MG tablet Take 1 tablet (50 mg total) by mouth once for 1 dose. TAKE 2 HOURS PRIOR TO YOUR CORONARY  CTA 11/14/18 11/14/18  Lorretta Harp, MD    Allergies    Patient has no known allergies.  Review of Systems   Review of Systems  Constitutional: Positive for activity change, appetite change and fatigue. Negative for fever.  Respiratory: Positive for shortness of breath and wheezing.   Cardiovascular: Negative for chest pain.  Gastrointestinal: Negative for abdominal pain, nausea and vomiting.  Musculoskeletal: Negative for myalgias.  Neurological: Negative for loss of consciousness and headaches.   Psychiatric/Behavioral: Positive for confusion.  All other systems reviewed and are negative.   Physical Exam Updated Vital Signs BP (!) 91/55   Pulse 85   Temp 98.3 F (36.8 C) (Oral)   Resp (!) 0   Ht _0  (1.702 m)   Wt 81.6 kg   SpO2 100%   BMI 28.19 kg/m   Physical Exam Vitals and nursing note reviewed.  Constitutional:      Appearance: He is well-developed. He is ill-appearing.  HENT:     Head: Normocephalic and atraumatic.  Eyes:     Conjunctiva/sclera: Conjunctivae normal.  Cardiovascular:     Rate and Rhythm: Normal rate and regular rhythm.     Heart sounds: No murmur.  Pulmonary:     Effort: Pulmonary effort is normal. Tachypnea present. No respiratory distress.     Breath sounds: Wheezing and rales present.  Abdominal:     Palpations: Abdomen is soft.     Tenderness: There is no abdominal tenderness.  Musculoskeletal:     Cervical back: Neck supple.     Comments: Mild edema to the ankles  Skin:    General: Skin is warm and dry.  Neurological:     General: No focal deficit present.     Mental Status: He is alert.     ED Results / Procedures / Treatments   Labs (all labs ordered are listed, but only abnormal results are displayed) Labs Reviewed  CBC WITH DIFFERENTIAL/PLATELET - Abnormal; Notable for the following components:      Result Value   WBC 13.5 (*)    RBC 3.59 (*)    Hemoglobin 10.7 (*)    HCT 34.5 (*)    Neutro Abs 11.0 (*)    Monocytes Absolute 1.1 (*)    Abs Immature Granulocytes 0.20 (*)    All other components within normal limits  BRAIN NATRIURETIC PEPTIDE - Abnormal; Notable for the following components:   B Natriuretic Peptide 311.9 (*)    All other components within normal limits  COMPREHENSIVE METABOLIC PANEL - Abnormal; Notable for the following components:   Sodium 125 (*)    Potassium >7.5 (*)    Chloride 86 (*)    Glucose, Bld 221 (*)    BUN 37 (*)    Creatinine, Ser 1.50 (*)    Calcium 8.2 (*)    Albumin 2.9 (*)     GFR calc non Af Amer 44 (*)    GFR calc Af Amer 51 (*)    All other components within normal limits  BASIC METABOLIC PANEL - Abnormal; Notable for the following components:   Sodium 125 (*)    Potassium 5.5 (*)    Chloride 90 (*)    Glucose, Bld 275 (*)    BUN 40 (*)    Creatinine, Ser 1.42 (*)    Calcium 8.0 (*)    GFR calc non Af Amer 47 (*)    GFR calc Af Amer 54 (*)    Anion gap 4 (*)  All other components within normal limits  BASIC METABOLIC PANEL - Abnormal; Notable for the following components:   Sodium 125 (*)    Potassium 5.6 (*)    Chloride 89 (*)    Glucose, Bld 264 (*)    BUN 41 (*)    Creatinine, Ser 1.50 (*)    Calcium 8.0 (*)    GFR calc non Af Amer 44 (*)    GFR calc Af Amer 51 (*)    All other components within normal limits  HEMOGLOBIN A1C - Abnormal; Notable for the following components:   Hgb A1c MFr Bld 7.9 (*)    All other components within normal limits  CBC - Abnormal; Notable for the following components:   RBC 3.27 (*)    Hemoglobin 9.9 (*)    HCT 31.6 (*)    All other components within normal limits  CREATININE, SERUM - Abnormal; Notable for the following components:   Creatinine, Ser 1.49 (*)    GFR calc non Af Amer 44 (*)    GFR calc Af Amer 51 (*)    All other components within normal limits  IRON AND TIBC - Abnormal; Notable for the following components:   Iron 35 (*)    Saturation Ratios 13 (*)    All other components within normal limits  RETICULOCYTES - Abnormal; Notable for the following components:   RBC. 3.28 (*)    Immature Retic Fract 29.8 (*)    All other components within normal limits  LACTATE DEHYDROGENASE - Abnormal; Notable for the following components:   LDH 205 (*)    All other components within normal limits  BILIRUBIN, DIRECT - Abnormal; Notable for the following components:   Bilirubin, Direct 0.3 (*)    All other components within normal limits  GLUCOSE, CAPILLARY - Abnormal; Notable for the following  components:   Glucose-Capillary 253 (*)    All other components within normal limits  POCT I-STAT 7, (LYTES, BLD GAS, ICA,H+H) - Abnormal; Notable for the following components:   pH, Arterial 7.196 (*)    pCO2 arterial 89.2 (*)    pO2, Arterial 78 (*)    Bicarbonate 34.7 (*)    TCO2 37 (*)    Acid-Base Excess 4.0 (*)    Sodium 124 (*)    Potassium 6.3 (*)    HCT 35.0 (*)    Hemoglobin 11.9 (*)    All other components within normal limits  CBG MONITORING, ED - Abnormal; Notable for the following components:   Glucose-Capillary 228 (*)    All other components within normal limits  POCT I-STAT EG7 - Abnormal; Notable for the following components:   pH, Ven 7.183 (*)    pCO2, Ven 92.5 (*)    pO2, Ven 107.0 (*)    Bicarbonate 34.8 (*)    TCO2 38 (*)    Acid-Base Excess 4.0 (*)    Sodium 127 (*)    Potassium 5.7 (*)    Calcium, Ion 1.10 (*)    HCT 34.0 (*)    Hemoglobin 11.6 (*)    All other components within normal limits  POCT I-STAT 7, (LYTES, BLD GAS, ICA,H+H) - Abnormal; Notable for the following components:   pH, Arterial 7.323 (*)    pCO2 arterial 68.7 (*)    pO2, Arterial 507 (*)    Bicarbonate 35.8 (*)    TCO2 38 (*)    Acid-Base Excess 8.0 (*)    Sodium 126 (*)    Potassium 5.5 (*)  Calcium, Ion 1.14 (*)    HCT 31.0 (*)    Hemoglobin 10.5 (*)    All other components within normal limits  SARS CORONAVIRUS 2 BY RT PCR [facility] ORDER, [place] LAB)    EKG EKG Interpretation  Date/Time:  Monday Oct 31 2019 14:17:28 EDT Ventricular Rate:  107 PR Interval:    QRS Duration: 134 QT Interval:  340 QTC Calculation: 948 R Axis:   -98 Text Interpretation: Sinus tachycardia Consider right atrial enlargement Right bundle branch block Mild STE I, aVL Confirmed by Virgel Manifold [id number]) on 10/31/2019 3:35:06 PM   Radiology DG Chest Portable 1 View  Result Date: 10/31/2019 CLINICAL DATA:  Intubation. EXAM: PORTABLE CHEST 1 VIEW COMPARISON:   Radiograph earlier this day. FINDINGS: Endotracheal tube tip 2.8 cm from the carina. Tip and side port of the enteric tube below the diaphragm in the stomach. Low lung volumes persist. Stable cardiomegaly. Diffuse heterogeneous airspace opacities unchanged from earlier today. No pneumothorax or large pleural effusion. Right costophrenic angle inadvertently excluded from the field of view. IMPRESSION: 1. Endotracheal tube tip 2.8 cm from the carina. Tip and side port of the enteric tube below the diaphragm in the stomach. 2. Unchanged cardiomegaly and diffuse heterogeneous airspace opacities, pulmonary edema versus multifocal infection. Electronically Signed   By: Keith Rake M.D.   On: 10/31/2019 21:14   DG Chest Port 1 View  Result Date: 10/31/2019 CLINICAL DATA:  Volume overload EXAM: PORTABLE CHEST 1 VIEW COMPARISON:  None. FINDINGS: Enlarged cardiac silhouette. There is diffuse airspace disease. Small RIGHT effusion. No pneumothorax. IMPRESSION: 1. Cardiomegaly and diffuse airspace disease suggests pulmonary edema from congestive heart failure. 2. Suspect underlying interstitial edema. 3. Small RIGHT effusion. Electronically Signed   By: Suzy Bouchard M.D.   On: 10/31/2019 15:18    Procedures Procedure Name: Intubation Date/Time: 10/31/2019 8:54 PM Performed by: Delma Post, MD Pre-anesthesia Checklist: Patient identified, Patient being monitored, Timeout performed, Emergency Drugs available and Suction available Oxygen Delivery Method: Non-rebreather mask Preoxygenation: Pre-oxygenation with 100% oxygen Induction Type: IV induction, Rapid sequence and Cricoid Pressure applied Laryngoscope Size: Mac and 4 Grade View: Grade I Tube size: 8.0 mm Number of attempts: 1 Airway Equipment and Method: Rigid stylet Placement Confirmation: ETT inserted through vocal cords under direct vision Secured at: 25 cm Tube secured with: ETT holder Future Recommendations: Recommend- induction with  short-acting agent, and alternative techniques readily available Comments: Cords appeared small direct visualization.  However 8.0 tube was easily passed.      (including critical care time)  Medications Ordered in ED Medications  ipratropium-albuterol (DUONEB) 0.5-2.5 (3) MG/3ML nebulizer solution (  Not Given 10/31/19 1422)  albuterol (PROVENTIL,VENTOLIN) solution continuous neb (15 mg/hr Nebulization New Bag/Given 10/31/19 1649)  propofol (DIPRIVAN) 1000 MG/100ML infusion (45 mcg/kg/min  81.6 kg Intravenous Rate/Dose Change 11/01/19 0000)  ipratropium-albuterol (DUONEB) 0.5-2.5 (3) MG/3ML nebulizer solution 3 mL (3 mLs Nebulization Given by Other 10/31/19 1411)  methylPREDNISolone sodium succinate (SOLU-MEDROL) 125 mg/2 mL injection 125 mg (125 mg Intravenous Given 10/31/19 1619)  cefTRIAXone (ROCEPHIN) 1 g in sodium chloride 0.9 % 100 mL IVPB (0 g Intravenous Stopped 10/31/19 1703)  azithromycin (ZITHROMAX) 500 mg in sodium chloride 0.9 % 250 mL IVPB (0 mg Intravenous Stopped 10/31/19 1833)  LORazepam (ATIVAN) injection 0.5 mg (0.5 mg Intravenous Given 10/31/19 1706)  calcium gluconate inj 10% (1 g) URGENT USE ONLY! (1 g Intravenous Given 10/31/19 1710)  insulin aspart (novoLOG) injection 10 Units (10 Units Intravenous Given  10/31/19 1708)    And  dextrose 50 % solution 50 mL (50 mLs Intravenous Given 10/31/19 1712)  sodium bicarbonate injection 50 mEq (50 mEq Intravenous Given 10/31/19 1712)  etomidate (AMIDATE) injection (20 mg Intravenous Given 10/31/19 2039)  rocuronium [facility]) injection (100 mg Intravenous Given 10/31/19 2039)    ED Course  I have reviewed the triage vital signs and the nursing notes.  Pertinent labs & imaging results that were available during my care of the patient were reviewed by me and considered in my medical decision making (see chart for details).  Clinical Course as of Nov 01 54  Mon Oct 31, 2019  1623 Patient reassessed, wants to take off his nonrebreather  mask.  Covid swabs negative.  RT at bedside to attempt BiPAP.  Need for BiPAP explained to patient and translated through daughter in room.   [AL]  1656 Significant hyperkalemia, temporizing measures ordered, cardioprotection ordered   [AL]    Clinical Course User Index [AL] Delma Post, MD   MDM Rules/Calculators/A&P                      Differential diagnosis: Interstitial lung disease, hypoxic and hypercarbic respiratory failure, lung infection  ED physician interpretation of imaging: Chest x-ray with interstitial lung disease, difficult to evaluate pulmonary edema in the setting of this chronic lung disease. ED physician interpretation of EKG: No STEMI ED physician interpretation of labs: Significant hypercarbia on ABG,  No improvement on repeat VBG.  Significant hyperkalemia without hemolysis.  MDM: Patient is a 79 year old male presenting to the ED with shortness of breath, found to be in hypercarbic respiratory failure who failed trial of BiPAP requiring intubation and admission to the ICU for further treatment and management.  Additionally patient had unexplained hyperkalemia requiring temporizing and cardiac protection with improvement.  Patient's vital signs stable with significant tachypnea to the 30s.  Patient SPO2 on 5 L 100%, patient reportedly uses 3 to 5 L at baseline.  Based on patient's declining respiratory status, he was intubated per procedure note above.  Broad-spectrum antibiotics given for lung infection.   Patient's family is updated multiple times at bedside during patient's care.  Patient mated to the ICU for further care management.  Signout given.   Final Clinical Impression(s) / ED Diagnoses Final diagnoses:  Acute respiratory failure with hypoxia and hypercapnia [facility])    Rx / DC Orders ED Discharge Orders    None       Delma Post, MD 11/01/19 1607    Virgel Manifold, MD 11/01/19 1453

## 2019-10-31 NOTE — Progress Notes (Signed)
Failed NIV, venous blood gas showing hypercarbic respiratory failure. Patient was intubated per MD Physician. 8.0 ETT positive color change noted. BBS to to auscultation are equal bilaterally fine crackles noted. CCM at bedside

## 2019-10-31 NOTE — ED Triage Notes (Signed)
Pt BIB [facility] from home w/ SOB. 2L [place] at bedside, placed on 15 L NRB. Diminished breath sounds. Pt received 125 solumedrol, 2g Mag PTA. NAD noted. Spanish speaking.

## 2019-11-01 ENCOUNTER — Inpatient Hospital Stay (HOSPITAL_COMMUNITY): Payer: Medicare Other

## 2019-11-01 DIAGNOSIS — I361 Nonrheumatic tricuspid (valve) insufficiency: Secondary | ICD-10-CM

## 2019-11-01 DIAGNOSIS — J849 Interstitial pulmonary disease, unspecified: Secondary | ICD-10-CM | POA: Diagnosis not present

## 2019-11-01 DIAGNOSIS — J9622 Acute and chronic respiratory failure with hypercapnia: Secondary | ICD-10-CM | POA: Diagnosis not present

## 2019-11-01 DIAGNOSIS — J9621 Acute and chronic respiratory failure with hypoxia: Secondary | ICD-10-CM | POA: Diagnosis not present

## 2019-11-01 LAB — POCT I-STAT 7, (LYTES, BLD GAS, ICA,H+H)
Acid-Base Excess: 7 mmol/L — ABNORMAL HIGH (ref 0.0–2.0)
Bicarbonate: 33.6 mmol/L — ABNORMAL HIGH (ref 20.0–28.0)
Calcium, Ion: 1.17 mmol/L (ref 1.15–1.40)
HCT: 35 % — ABNORMAL LOW (ref 39.0–52.0)
Hemoglobin: 11.9 g/dL — ABNORMAL LOW (ref 13.0–17.0)
O2 Saturation: 98 %
Patient temperature: 98
Potassium: 5.3 mmol/L — ABNORMAL HIGH (ref 3.5–5.1)
Sodium: 128 mmol/L — ABNORMAL LOW (ref 135–145)
TCO2: 35 mmol/L — ABNORMAL HIGH (ref 22–32)
pCO2 arterial: 58.2 mmHg — ABNORMAL HIGH (ref 32.0–48.0)
pH, Arterial: 7.367 (ref 7.350–7.450)
pO2, Arterial: 103 mmHg (ref 83.0–108.0)

## 2019-11-01 LAB — GLUCOSE, CAPILLARY
Glucose-Capillary: 101 mg/dL — ABNORMAL HIGH (ref 70–99)
Glucose-Capillary: 152 mg/dL — ABNORMAL HIGH (ref 70–99)
Glucose-Capillary: 210 mg/dL — ABNORMAL HIGH (ref 70–99)
Glucose-Capillary: 253 mg/dL — ABNORMAL HIGH (ref 70–99)
Glucose-Capillary: 77 mg/dL (ref 70–99)
Glucose-Capillary: 82 mg/dL (ref 70–99)
Glucose-Capillary: 91 mg/dL (ref 70–99)

## 2019-11-01 LAB — CBC
HCT: 29.4 % — ABNORMAL LOW (ref 39.0–52.0)
Hemoglobin: 9.2 g/dL — ABNORMAL LOW (ref 13.0–17.0)
MCH: 29.6 pg (ref 26.0–34.0)
MCHC: 31.3 g/dL (ref 30.0–36.0)
MCV: 94.5 fL (ref 80.0–100.0)
Platelets: 222 10*3/uL (ref 150–400)
RBC: 3.11 MIL/uL — ABNORMAL LOW (ref 4.22–5.81)
RDW: 14.2 % (ref 11.5–15.5)
WBC: 8.9 10*3/uL (ref 4.0–10.5)
nRBC: 0 % (ref 0.0–0.2)

## 2019-11-01 LAB — BASIC METABOLIC PANEL
Anion gap: 9 (ref 5–15)
BUN: 40 mg/dL — ABNORMAL HIGH (ref 8–23)
CO2: 29 mmol/L (ref 22–32)
Calcium: 7.8 mg/dL — ABNORMAL LOW (ref 8.9–10.3)
Chloride: 88 mmol/L — ABNORMAL LOW (ref 98–111)
Creatinine, Ser: 1.43 mg/dL — ABNORMAL HIGH (ref 0.61–1.24)
GFR calc Af Amer: 54 mL/min — ABNORMAL LOW (ref >60)
GFR calc non Af Amer: 47 mL/min — ABNORMAL LOW (ref >60)
Glucose, Bld: 259 mg/dL — ABNORMAL HIGH (ref 70–99)
Potassium: 5.3 mmol/L — ABNORMAL HIGH (ref 3.5–5.1)
Sodium: 126 mmol/L — ABNORMAL LOW (ref 135–145)

## 2019-11-01 LAB — MRSA PCR SCREENING: MRSA by PCR: NEGATIVE

## 2019-11-01 LAB — MAGNESIUM: Magnesium: 1.9 mg/dL (ref 1.7–2.4)

## 2019-11-01 LAB — TRIGLYCERIDES: Triglycerides: 98 mg/dL (ref <150)

## 2019-11-01 LAB — ECHOCARDIOGRAM COMPLETE
Height: 67 in
Weight: 3245.17 oz

## 2019-11-01 LAB — PHOSPHORUS: Phosphorus: 5.1 mg/dL — ABNORMAL HIGH (ref 2.5–4.6)

## 2019-11-01 MED ORDER — ORAL CARE MOUTH RINSE
15.0000 mL | OROMUCOSAL | Status: DC
  Administered 2019-11-01 – 2019-11-02 (×12): 15 mL via OROMUCOSAL

## 2019-11-01 MED ORDER — SODIUM CHLORIDE 0.9 % IV SOLN
INTRAVENOUS | Status: DC | PRN
  Administered 2019-11-01: 1000 mL via INTRAVENOUS

## 2019-11-01 MED ORDER — PERFLUTREN LIPID MICROSPHERE
1.0000 mL | INTRAVENOUS | Status: AC | PRN
  Administered 2019-11-01: 2 mL via INTRAVENOUS
  Filled 2019-11-01 (×2): qty 10

## 2019-11-01 MED ORDER — CHLORHEXIDINE GLUCONATE 0.12% ORAL RINSE (MEDLINE KIT)
15.0000 mL | Freq: Two times a day (BID) | OROMUCOSAL | Status: DC
  Administered 2019-11-01 (×2): 15 mL via OROMUCOSAL

## 2019-11-01 MED ORDER — DOCUSATE SODIUM 50 MG/5ML PO LIQD
100.0000 mg | Freq: Two times a day (BID) | ORAL | Status: DC
  Administered 2019-11-01 – 2019-11-05 (×5): 100 mg
  Filled 2019-11-01 (×6): qty 10

## 2019-11-01 MED ORDER — POLYETHYLENE GLYCOL 3350 17 G PO PACK
17.0000 g | PACK | Freq: Every day | ORAL | Status: DC
  Administered 2019-11-01: 17 g
  Filled 2019-11-01: qty 1

## 2019-11-01 MED ORDER — CHLORHEXIDINE GLUCONATE CLOTH 2 % EX PADS
6.0000 | MEDICATED_PAD | Freq: Every day | CUTANEOUS | Status: DC
  Administered 2019-11-01 – 2019-11-05 (×5): 6 via TOPICAL

## 2019-11-01 MED ORDER — INSULIN GLARGINE 100 UNIT/ML ~~LOC~~ SOLN
10.0000 [IU] | Freq: Every day | SUBCUTANEOUS | Status: DC
  Administered 2019-11-01 – 2019-11-02 (×2): 10 [IU] via SUBCUTANEOUS
  Filled 2019-11-01 (×3): qty 0.1

## 2019-11-01 MED ORDER — FUROSEMIDE 10 MG/ML IJ SOLN
40.0000 mg | Freq: Once | INTRAMUSCULAR | Status: AC
  Administered 2019-11-01: 40 mg via INTRAVENOUS
  Filled 2019-11-01: qty 4

## 2019-11-01 MED ORDER — VITAL AF 1.2 CAL PO LIQD
1000.0000 mL | ORAL | Status: DC
  Administered 2019-11-01: 1000 mL

## 2019-11-01 MED ORDER — PRO-STAT SUGAR FREE PO LIQD
30.0000 mL | Freq: Four times a day (QID) | ORAL | Status: DC
  Administered 2019-11-01 – 2019-11-02 (×4): 30 mL
  Filled 2019-11-01 (×4): qty 30

## 2019-11-01 NOTE — Progress Notes (Signed)
eLink Physician-Brief Progress Note Patient Name: Hunter Harvey DOB: 01-28-1941 MRN: [id number]   Date of Service  11/01/2019  HPI/Events of Note  ABG on 40%/PRVC 16/TV 530/P 5 = 7.367/58.2/103.  eICU Interventions  Continue present ventilator management.      Intervention Category Major Interventions: Respiratory failure - evaluation and management  Hunter Harvey 11/01/2019, 2:35 AM

## 2019-11-01 NOTE — Progress Notes (Signed)
eLink Physician-Brief Progress Note Patient Name: BOLDEN HAGERMAN DOB: May 01, 1941 MRN: [id number]   Date of Service  11/01/2019  HPI/Events of Note  Oliguria - Bladder scan with 400 mL residual.   eICU Interventions  Plan: 1. I/O cath PRN.      Intervention Category Major Interventions: Other:  Abbee Cremeens Dennard Nip 11/01/2019, 6:12 AM

## 2019-11-01 NOTE — Progress Notes (Signed)
  Echocardiogram 2D Echocardiogram has been performed.  Hunter Harvey 11/01/2019, 10:11 AM

## 2019-11-01 NOTE — Progress Notes (Addendum)
NAME:  ARSEN MANGIONE, MRN:  [id number], DOB:  March 01, 1941, LOS: 1 ADMISSION DATE:  10/31/2019, CONSULTATION DATE: 5/31 REFERRING MD: Dr. Juleen China, CHIEF COMPLAINT: Shortness of breath  Brief History   79 year old male with interstitial lung disease admitted 5/31 with acute on chronic hypoxemic and hypercarbic respiratory failure requiring intubation.   History of present illness   Patient is encephalopathic and/or intubated. Therefore history has been obtained from chart review.  79 year old male with PMH significant for DM, interstitial lung disease, rheumatoid arthritis, hearing loss, and recurrent pulmonary infections. He wears 2L of oxygen at home. He was in his usual state of health until 5/31 when he developed progressive shortness of breath and ultimately altered sensorium, which prompted his presentation to [facility] ED. Immediately he was found to be hypoxemic and was escalated to 15L NRB with improvement of his O2 sats. He was eventually initiated on BiPAP. Imaging showed diffuse lung parenchymal abnormality. Laboratory evaluation significant for Na 125, K 7.5 (temporized in ED with improvement to 5.5), Glucose 221, Creatinine 1.5, WBC 13.5 and Hgb 10.7.   Significant Hospital Events   5/31 admit  Consults:    Procedures:  ETT 5/31 >  Significant Diagnostic Tests:  HRCT chest 5/31 > honeycombing, subpleural fibrosis craniocaudal gradient, nodular densities scattered with air bronchograms in the bases  Micro Data:  Blood 5/31 > Tracheal aspirate 5/31 >  Antimicrobials:  Ceftriaxone 5/31 > Azithromycin 5/31 >  Interim history/subjective:   Remains critically ill, intubated Sedated on propofol Good urine output  Objective   Blood pressure (!) 136/110, pulse 71, temperature 98.6 F (37 C), temperature source Oral, resp. rate 18, height 5\' 7"  (1.702 m), weight 92 kg, SpO2 100 %.    Vent Mode: PRVC FiO2 (%):  [40 %-100 %] 40 % Set Rate:  [16 bmp-24 bmp] 16 bmp Vt Set:   [460 mL-530 mL] 530 mL PEEP:  [5 cmH20] 5 cmH20 Plateau Pressure:  [22 cmH20-33 cmH20] 33 cmH20   Intake/Output Summary (Last 24 hours) at 11/01/2019 1055 Last data filed at 11/01/2019 1000 Gross per 24 hour  Intake 692.4 ml  Output 700 ml  Net -7.6 ml   Filed Weights   10/31/19 2015 11/01/19 0403  Weight: 81.6 kg 92 kg    Examination: General: elderly Hispanic man, intubated and sedated HENT: [place]/AT, PERRL, no JVD Lungs: Diffuse rales bilateral one third Cardiovascular: RRR, no MRG Abdomen: Soft, non-distended Extremities: No acute deformity. 1+ edema to ankle of BLE Neuro: Sedated on propofol, RASS -1  Chest x-ray personally reviewed 6/1 which shows cardiomegaly with bilateral interstitial infiltrates, improved from 5/31.  Labs show hyponatremia, decreased hyperkalemia, decrease leukocytosis, stable anemia. Stable renal function.  ABG shows improved compensated, chronic respiratory acidosis  Resolved Hospital Problem list     Assessment & Plan:  Acute on chronic hypoxemic respiratory failure: Acute hypercarbic respiratory failure:  He has history of ILD, felt possibly secondary to RA vs NSIP based on notes in care everywhere although HRCT suggests IPF. Given heraing loss and anemia consider CVID. He also has history of recurrent pulmonary infections including moraxella.  -Acute worsening seems to have been related to fluid versus pneumonia -Start spontaneous breathing trials - Empiric CAP coverage - Send IgG, IgA, IgM, and beta-microglobulin. Check anemia panel, ldh, bilirubin, haptiglobin.  -Never smoker so doubt COPD exacerbation, started on empiric steroids and bronchodilators we will rapidly taper    AKI Hyponatremia Hyperkalemia , related to acidosis, improving - Follow BMP -40 mg  Lasix daily  DM - CBG monitoring and SSI - NPO - Hold home metformin -Add 10 units of Lantus while on steroids  Hypotension - Hold home metoprolol while on propofol  Goals of  care -gradual worsening described by son over the last year, discuss prognosis especially in lieu of hypercarbia being a terminal event with ILD -Agreeable to limited CODE STATUS  Best practice:  Diet: NPO Pain/Anxiety/Delirium protocol (if indicated): Propofol, PRN fentanyl. RASS goal 0 VAP protocol (if indicated): per protocol DVT prophylaxis: sq heparin GI prophylaxis: PPI Glucose control: SSI Mobility: BR Code Status: FULL Family Communication: son updated Disposition: ICU  The patient is critically ill with multiple organ systems failure and requires high complexity decision making for assessment and support, frequent evaluation and titration of therapies, application of advanced monitoring technologies and extensive interpretation of multiple databases. Critical Care Time devoted to patient care services described in this note independent of APP/resident  time is 35 minutes.   Kara Mead MD. [place]. [place] [facility]  If no response to pager , please call [phone]   11/01/2019

## 2019-11-01 NOTE — Progress Notes (Signed)
Initial Nutrition Assessment  DOCUMENTATION CODES:   Not applicable  INTERVENTION:   Tube feeding:  -Vital AF 1.2 @ 20 ml/hr via OGT -Increase per CCM to goal rate of 40 ml/hr (960 ml) -30 ml Prostat QID  Provides: 1552 kcals (1745 kcal with propofol), 132 grams protein, 779 ml free water.   NUTRITION DIAGNOSIS:   Increased nutrient needs related to acute illness as evidenced by estimated needs.  GOAL:   Patient will meet greater than or equal to 90% of their needs  MONITOR:   Weight trends, Labs, I & O's, Vent status, Skin, TF tolerance  REASON FOR ASSESSMENT:   Ventilator    ASSESSMENT:   Patient with PMH significant for DM, ILD, rheumatoid arthritis, hearing loss, and recurrent pulmonary infections. Presents this admission with acute on chronic hypoxemic respiratory failure.   Pt discussed during ICU rounds and with RN.   Remains on propofol. Starting SBT. Okay to start feeding per CCM.   Records indicate pt weighed 84.6 kg on 08/06/2018 and 92 kg this admission. Son denies recent weight loss and endorses gradual fluid weight gain over the last week.   Patient is currently intubated on ventilator support MV: 8.5 L/min Temp (24hrs), Avg:98.3 F (36.8 C), Min:97.9 F (36.6 C), Max:98.6 F (37 C)  Propofol: 7.3 ml/hr- provides 193 ml from lipids daily    I/O: 27 ml since admit  UOP: 700 ml x 24 hrs   Drips: propofol Medications: colace, SS novolog, lantus, miralax Labs: Na 128 (L) K 5.3 (H) Cr 1.43- trending down Phosphorus 5.1 (H) CBG 91-264   NUTRITION - FOCUSED PHYSICAL EXAM:    Most Recent Value  Orbital Region  No depletion  Upper Arm Region  Mild depletion  Thoracic and Lumbar Region  Unable to assess  Buccal Region  No depletion  Temple Region  Mild depletion  Clavicle Bone Region  Mild depletion  Clavicle and Acromion Bone Region  Mild depletion  Scapular Bone Region  Unable to assess  Dorsal Hand  No depletion  Patellar Region  No  depletion  Anterior Thigh Region  No depletion  Posterior Calf Region  No depletion  Edema (RD Assessment)  Mild  Hair  Reviewed  Eyes  Unable to assess  Mouth  Unable to assess  Skin  Reviewed  Nails  Reviewed     Diet Order:   Diet Order            Diet NPO time specified  Diet effective now              EDUCATION NEEDS:   Not appropriate for education at this time  Skin:  Skin Assessment: Reviewed RN Assessment  Last BM:  PTA  Height:   Ht Readings from Last 1 Encounters:  10/31/19 5\' 7"  (1.702 m)    Weight:   Wt Readings from Last 1 Encounters:  11/01/19 92 kg    BMI:  Body mass index is 31.77 kg/m.  Estimated Nutritional Needs:   Kcal:  1705 kcal  Protein:  130-150 grams  Fluid:  >/= 1.7 L.day   01/01/20 RD, LDN Clinical Nutrition Pager listed in AMION

## 2019-11-01 NOTE — Progress Notes (Signed)
NAME:  Hunter Harvey, MRN:  [id number], DOB:  December 31, 1940, LOS: 1 ADMISSION DATE:  10/31/2019, CONSULTATION DATE:  11/01/19 REFERRING MD:  Dr. Wilson Singer, CHIEF COMPLAINT:  Shortness of Breath   Brief History   79 year old male with interstitial lung disease admitted 5/31 with acute on chronic hypoxemic and hypercarbic respiratory failure requiring intubation.   History of present illness   79 year old male with PMH significant for DM, interstitial lung disease, rheumatoid arthritis, hearing loss, and recurrent pulmonary infections. He wears 2L of oxygen at home. He was in his usual state of health until 5/31 when he developed progressive shortness of breath and ultimately altered sensorium, which prompted his presentation to [facility] ED. Immediately he was found to be hypoxemic and was escalated to 15L NRB with improvement of his O2 sats. He was eventually initiated on BiPAP. Imaging showed diffuse lung parenchymal abnormality. Laboratory evaluation significant for Na 125, K 7.5 (temporized in ED with improvement to 5.5), Glucose 221, Creatinine 1.5, WBC 13.5 and Hgb 10.7.  Past Medical History  RA, ILD, HTN, DM  Significant Hospital Events   5/31 admit to [place], ETT   Consults:  PCCM  Procedures:  ETT 5/31 >  Significant Diagnostic Tests:  CT chest 5/31 > Diffuse chronic fibrotic changes and emphysematous change is noted.Small bilateral pleural effusions are seen. Patchy somewhat nodular densities are identified bilaterally likely postinflammatory in nature although short-term follow-up is recommended in 3-6 weeks. Mild infiltrate is noted in the bases Bilaterally. Small pericardial effusion.   Micro Data:  Blood 5/31 > Tracheal aspirate 5/31 >  Antimicrobials:  Ceftriaxone 5/31 > Azithromycin 5/31 >  Interim history/subjective:  NAEON. O2 sat >92% on 40% FIO2. Pt remains afebrile, comfortably sedated on propofol. ABG improving w/ current vent settings.  Received 40 lasix last  evening in ED.  Objective   Blood pressure 93/60, pulse 69, temperature 98.6 F (37 C), temperature source Oral, resp. rate 16, height 5\' 7"  (1.702 m), weight 92 kg, SpO2 100 %.    Vent Mode: PRVC FiO2 (%):  [40 %-100 %] 40 % Set Rate:  [16 bmp-24 bmp] 16 bmp Vt Set:  [460 mL-530 mL] 530 mL PEEP:  [5 cmH20] 5 cmH20 Plateau Pressure:  [22 cmH20-33 cmH20] 33 cmH20   Intake/Output Summary (Last 24 hours) at 11/01/2019 0842 Last data filed at 11/01/2019 0800 Gross per 24 hour  Intake 557.03 ml  Output 700 ml  Net -142.97 ml   Filed Weights   10/31/19 2015 11/01/19 0403  Weight: 81.6 kg 92 kg    Examination: General: elderly male resting comfortably on vent HENT: [place]/AT, PERRL, no JVD Lungs: Diffuse crackles Cardiovascular: RRR, No M,R,G Abdomen: Soft, NT, ND Extremities: No acute deformity. Trace edema to ankle bilaterally Neuro: Sedated post intubation GU: intact   Resolved Hospital Problem list     Assessment & Plan:  Acute on chronic hypoxemic respiratory failure: Acute hypercarbic respiratory failure:  Currently intubated w/ O2 sats stable >92%.  He has history of ILD, felt possibly secondary to RA vs NSIP based on notes in care everywhere.  ABG this AM improving at 7.36/58.2/103. Plan to wean from sedation and then wean from ventilator per rapid wean protocol.  Given heraing loss and anemia consider CVID. Pulmonary edema also a compounding factor. He also has history of recurrent pulmonary infections including moraxella.  - Wean vent as tolerated - Follow ABG - Continue CAP antibiotics - F/u resp cultures - F/u blood cultures - Diuresis as  tolerated to euvolemia (40mg  lasix given in ED) - F/u IgG, IgA, IgM, and beta-microglobulin. Check anemia panel, ldh, bilirubin, haptiglobin.  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  - Monitor UOP - f/u UA  Hyponatremia - Stable, low at 125 today. Unclear etiology,  could be related to underlying cardiac pathology given BNP elevated to 311, less likely related to hyperglycemia or reduced GFR  - Follow BMP  Hyperkalemia - Improving overnight with medical Rx. No EKG changes were noted. K at 5.3 this AM. Unclear etiology, considering hemolysis, respiratory acidosis, reduced urinary K excretion, hypoaldosteronism. Will continue to monitor and consider further workup - Follow BMP - Diurese as tolerated - f/u UA - f/u hemolysis labs - haptoglobin, reticulocyte, LDH  Macrocytic anemia - associated w/ recent hyperkalemia, concern for hemolytic process. LDH elevated to 205. Reticulocyte count WNL.  - F/u haptoglobin - Consider B12 and Folate levels   DM - CBG monitoring and SSI - NPO - Hold home metformin  Hypotension - Hold home metoprolol  Best practice:  Diet: NPO Pain/Anxiety/Delirium protocol (if indicated): Propofol, PRN fentanyl, RASS goal -1 to 0 VAP protocol (if indicated): per protocol DVT prophylaxis: sq heparin GI prophylaxis:PPI Glucose control: SSI Mobility: bedrest Code Status: full Family Communication: daughter updated Disposition: ICU  Labs   CBC: Recent Labs  Lab 10/31/19 1503 10/31/19 1534 10/31/19 1934 10/31/19 2127 10/31/19 2149 11/01/19 0103 11/01/19 0225  WBC 13.5*  --   --  9.8  --  8.9  --   NEUTROABS 11.0*  --   --   --   --   --   --   HGB 10.7*   < > 11.6* 9.9* 10.5* 9.2* 11.9*  HCT 34.5*   < > 34.0* 31.6* 31.0* 29.4* 35.0*  MCV 96.1  --   --  96.6  --  94.5  --   PLT 275  --   --  246  --  222  --    < > = values in this interval not displayed.    Basic Metabolic Panel: Recent Labs  Lab 10/31/19 1503 10/31/19 1534 10/31/19 1839 10/31/19 1839 10/31/19 1934 10/31/19 2127 10/31/19 2149 10/31/19 2215 11/01/19 0103 11/01/19 0225  NA 125*   < > 125*   < > 127*  --  126* 125* 126* 128*  K >7.5*   < > 5.5*   < > 5.7*  --  5.5* 5.6* 5.3* 5.3*  CL 86*  --  90*  --   --   --   --  89* 88*  --   CO2  30  --  31  --   --   --   --  28 29  --   GLUCOSE 221*  --  275*  --   --   --   --  264* 259*  --   BUN 37*  --  40*  --   --   --   --  41* 40*  --   CREATININE 1.50*  --  1.42*  --   --  1.49*  --  1.50* 1.43*  --   CALCIUM 8.2*  --  8.0*  --   --   --   --  8.0* 7.8*  --   MG  --   --   --   --   --   --   --   --  1.9  --  PHOS  --   --   --   --   --   --   --   --  5.1*  --    < > = values in this interval not displayed.   GFR: Estimated Creatinine Clearance: 46.1 mL/min (A) (by C-G formula based on SCr of 1.43 mg/dL (H)). Recent Labs  Lab 10/31/19 1503 10/31/19 2127 11/01/19 0103  WBC 13.5* 9.8 8.9    Liver Function Tests: Recent Labs  Lab 10/31/19 1503 10/31/19 2127  AST 25  --   ALT 17  --   ALKPHOS 86  --   BILITOT 0.3 1.1  PROT 7.8  --   ALBUMIN 2.9*  --    No results for input(s): LIPASE, AMYLASE in the last 168 hours. No results for input(s): AMMONIA in the last 168 hours.  ABG    Component Value Date/Time   PHART 7.367 11/01/2019 0225   PCO2ART 58.2 (H) 11/01/2019 0225   PO2ART 103 11/01/2019 0225   HCO3 33.6 (H) 11/01/2019 0225   TCO2 35 (H) 11/01/2019 0225   O2SAT 98.0 11/01/2019 0225     Coagulation Profile: No results for input(s): INR, PROTIME in the last 168 hours.  Cardiac Enzymes: No results for input(s): CKTOTAL, CKMB, CKMBINDEX, TROPONINI in the last 168 hours.  HbA1C: Hgb A1c MFr Bld  Date/Time Value Ref Range Status  10/31/2019 09:27 PM 7.9 (H) 4.8 - 5.6 % Final    Comment:    (NOTE) Pre diabetes:          5.7%-6.4% Diabetes:              >6.4% Glycemic control for   <7.0% adults with diabetes     CBG: Recent Labs  Lab 10/31/19 1922 11/01/19 0016 11/01/19 0356 11/01/19 0803  GLUCAP 228* 253* 210* 152*    Review of Systems:   Patient is intubated. History obtained from chart review.   Past Medical History  He,  has a past medical history of Diabetes (HCC) and Hyperlipidemia.   Surgical History   History  reviewed. No pertinent surgical history.   Social History   reports that he has never smoked. He has never used smokeless tobacco. He reports previous alcohol use. He reports that he does not use drugs.   Family History   His family history is not on file.   Allergies No Known Allergies   Home Medications  Prior to Admission medications   Medication Sig Start Date End Date Taking? Authorizing Provider  ipratropium (ATROVENT) 0.02 % nebulizer solution Inhale 2.5 mLs into the lungs in the morning, at noon, in the evening, and at bedtime. 07/14/18  Yes [provider]  isosorbide dinitrate (ISORDIL) 30 MG tablet Take 30 mg by mouth 4 (four) times daily.   Yes [provider]  isosorbide mononitrate (IMDUR) 30 MG 24 hr tablet Take 30 mg by mouth daily.   Yes [provider]  omeprazole (PRILOSEC) 40 MG capsule Take 40 mg by mouth daily.   Yes [provider]  sulfaSALAzine (AZULFIDINE) 500 MG tablet Take 500 mg by mouth 3 (three) times daily.   Yes [provider]  tamsulosin (FLOMAX) 0.4 MG CAPS capsule Take 0.4 mg by mouth daily.   Yes [provider]  Accu-Chek FastClix Lancets MISC 1 each by Other route daily. 09/06/19   [provider]  albuterol (PROVENTIL HFA;VENTOLIN HFA) 108 (90 Base) MCG/ACT inhaler Inhale 1-2 puffs into the lungs every 6 (six) hours  as needed for wheezing or shortness of breath.     [provider]  aspirin 81 MG chewable tablet Chew 81 mg by mouth daily.     [provider]  atorvastatin (LIPITOR) 20 MG tablet Take 20 mg by mouth daily.     [provider]  B-D ULTRAFINE III SHORT PEN 31G X 8 MM MISC 1 each by Other route daily. 08/08/19   [provider]  ibuprofen (ADVIL) 800 MG tablet Take 800 mg by mouth every 8 (eight) hours as needed for pain. 06/08/19   [provider]  Insulin Glargine, 1 Unit Dial, 300 UNIT/ML SOPN Inject into the skin. 07/10/17   [provider]  metFORMIN (GLUCOPHAGE) 1000 MG tablet Take 1,000 mg by mouth daily with breakfast.     [provider]  metoprolol tartrate (LOPRESSOR) 50 MG tablet Take 1 tablet (50 mg total) by mouth once for 1 dose. TAKE 2 HOURS PRIOR TO YOUR CORONARY CTA 11/14/18 11/14/18  Runell Gess, MD  NOVOLOG 100 UNIT/ML injection  10/07/19   [provider]  [facility] ULTRA test strip 1 each by Other route daily. 09/06/19   [provider]  predniSONE (DELTASONE) 10 MG tablet Take 10 mg by mouth daily. 07/20/19   [provider]  STIOLTO RESPIMAT 2.5-2.5 MCG/ACT AERS Take 2 puffs by mouth daily. 10/24/19   [provider]  Vitamin D, Ergocalciferol, (DRISDOL) 1.25 MG (50000 UNIT) CAPS capsule Take 50,000 Units by mouth once a week. 09/06/19   [provider]     Critical care time:     Gerrit Halls, Medical Student

## 2019-11-01 NOTE — Progress Notes (Addendum)
eLink Physician-Brief Progress Note Patient Name: Hunter Harvey DOB: 03-26-1941 MRN: [id number]   Date of Service  11/01/2019  HPI/Events of Note  Review of CXR reveals ETT to be 9 mm above the carina.   eICU Interventions  Plan: 1. Withdraw ETT 2 cm. 2. Repeat portable CXR for ETT position.      Intervention Category Major Interventions: Other:  Laymond Postle Dennard Nip 11/01/2019, 6:15 AM

## 2019-11-01 NOTE — Progress Notes (Signed)
ETT tube withdraw by 2cm per MD. ETT tube is now at 22 at the lip and is secure.

## 2019-11-02 ENCOUNTER — Inpatient Hospital Stay (HOSPITAL_COMMUNITY): Payer: Medicare Other

## 2019-11-02 DIAGNOSIS — J9601 Acute respiratory failure with hypoxia: Secondary | ICD-10-CM

## 2019-11-02 DIAGNOSIS — J9602 Acute respiratory failure with hypercapnia: Secondary | ICD-10-CM | POA: Diagnosis not present

## 2019-11-02 DIAGNOSIS — J849 Interstitial pulmonary disease, unspecified: Secondary | ICD-10-CM | POA: Diagnosis not present

## 2019-11-02 LAB — CBC
HCT: 29.6 % — ABNORMAL LOW (ref 39.0–52.0)
Hemoglobin: 9.5 g/dL — ABNORMAL LOW (ref 13.0–17.0)
MCH: 29.6 pg (ref 26.0–34.0)
MCHC: 32.1 g/dL (ref 30.0–36.0)
MCV: 92.2 fL (ref 80.0–100.0)
Platelets: 305 10*3/uL (ref 150–400)
RBC: 3.21 MIL/uL — ABNORMAL LOW (ref 4.22–5.81)
RDW: 14.6 % (ref 11.5–15.5)
WBC: 10.9 10*3/uL — ABNORMAL HIGH (ref 4.0–10.5)
nRBC: 0 % (ref 0.0–0.2)

## 2019-11-02 LAB — BASIC METABOLIC PANEL
Anion gap: 8 (ref 5–15)
BUN: 45 mg/dL — ABNORMAL HIGH (ref 8–23)
CO2: 32 mmol/L (ref 22–32)
Calcium: 8.1 mg/dL — ABNORMAL LOW (ref 8.9–10.3)
Chloride: 92 mmol/L — ABNORMAL LOW (ref 98–111)
Creatinine, Ser: 0.95 mg/dL (ref 0.61–1.24)
GFR calc Af Amer: >60 mL/min (ref >60)
GFR calc non Af Amer: >60 mL/min (ref >60)
Glucose, Bld: 118 mg/dL — ABNORMAL HIGH (ref 70–99)
Potassium: 4.6 mmol/L (ref 3.5–5.1)
Sodium: 132 mmol/L — ABNORMAL LOW (ref 135–145)

## 2019-11-02 LAB — GLUCOSE, CAPILLARY
Glucose-Capillary: 100 mg/dL — ABNORMAL HIGH (ref 70–99)
Glucose-Capillary: 120 mg/dL — ABNORMAL HIGH (ref 70–99)
Glucose-Capillary: 132 mg/dL — ABNORMAL HIGH (ref 70–99)
Glucose-Capillary: 88 mg/dL (ref 70–99)
Glucose-Capillary: 67 mg/dL — ABNORMAL LOW (ref 70–99)

## 2019-11-02 LAB — BETA 2 MICROGLOBULIN, SERUM: Beta-2 Microglobulin: 2.9 mg/L — ABNORMAL HIGH (ref 0.6–2.4)

## 2019-11-02 LAB — TRIGLYCERIDES: Triglycerides: 145 mg/dL (ref <150)

## 2019-11-02 LAB — IGG: IgG (Immunoglobin G), Serum: 2199 mg/dL — ABNORMAL HIGH (ref 603–1613)

## 2019-11-02 LAB — PHOSPHORUS: Phosphorus: 3.8 mg/dL (ref 2.5–4.6)

## 2019-11-02 LAB — IGM: IgM (Immunoglobulin M), Srm: 47 mg/dL (ref 15–143)

## 2019-11-02 LAB — MAGNESIUM: Magnesium: 2.1 mg/dL (ref 1.7–2.4)

## 2019-11-02 LAB — IGA: IgA: 431 mg/dL (ref 61–437)

## 2019-11-02 LAB — STREP PNEUMONIAE URINARY ANTIGEN: Strep Pneumo Urinary Antigen: NEGATIVE

## 2019-11-02 LAB — HAPTOGLOBIN: Haptoglobin: 257 mg/dL (ref 34–355)

## 2019-11-02 MED ORDER — DEXTROSE 50 % IV SOLN
INTRAVENOUS | Status: AC
  Administered 2019-11-02: 50 mL
  Filled 2019-11-02: qty 50

## 2019-11-02 MED ORDER — FUROSEMIDE 10 MG/ML IJ SOLN
40.0000 mg | Freq: Once | INTRAMUSCULAR | Status: AC
  Administered 2019-11-02: 40 mg via INTRAVENOUS
  Filled 2019-11-02: qty 4

## 2019-11-02 MED ORDER — CHLORHEXIDINE GLUCONATE 0.12 % MT SOLN
15.0000 mL | Freq: Two times a day (BID) | OROMUCOSAL | Status: DC
  Administered 2019-11-03 – 2019-11-05 (×5): 15 mL via OROMUCOSAL
  Filled 2019-11-02 (×5): qty 15

## 2019-11-02 MED ORDER — ORAL CARE MOUTH RINSE
15.0000 mL | Freq: Two times a day (BID) | OROMUCOSAL | Status: DC
  Administered 2019-11-02 – 2019-11-05 (×5): 15 mL via OROMUCOSAL

## 2019-11-02 MED ORDER — CHLORHEXIDINE GLUCONATE 0.12 % MT SOLN
OROMUCOSAL | Status: AC
  Administered 2019-11-02: 15 mL via OROMUCOSAL
  Filled 2019-11-02: qty 15

## 2019-11-02 NOTE — Progress Notes (Deleted)
   [address]  [place], [place] [address]  11/02/2019  RE: Hunter Harvey  To Whom It May Concern:  Please be advised thatJesus Harvey  was admitted to [facility] on 10/31/2019.The patient is currently under the services of Dr. Cyril Mourning .The patient's discharge date has not been determined at this time. Thanks, Gae Gallop RN,BSN,CM  [place]/toc Department Cell: [phone]

## 2019-11-02 NOTE — Progress Notes (Signed)
NAME:  Hunter Harvey, MRN:  [id number], DOB:  1940-07-17, LOS: 2 ADMISSION DATE:  10/31/2019, CONSULTATION DATE: 5/31 REFERRING MD: Dr. Wilson Singer, CHIEF COMPLAINT: Shortness of breath  Brief History   79 year old male with interstitial lung disease admitted 5/31 with acute on chronic hypoxemic and hypercarbic respiratory failure requiring intubation.   History of present illness   Patient is encephalopathic and/or intubated. Therefore history has been obtained from chart review.  79 year old male with PMH significant for DM, interstitial lung disease, rheumatoid arthritis, hearing loss, and recurrent pulmonary infections. He wears 2L of oxygen at home. He was in his usual state of health until 5/31 when he developed progressive shortness of breath and ultimately altered sensorium, which prompted his presentation to [facility] ED. Immediately he was found to be hypoxemic and was escalated to 15L NRB with improvement of his O2 sats. He was eventually initiated on BiPAP. Imaging showed diffuse lung parenchymal abnormality. Laboratory evaluation significant for Na 125, K 7.5 (temporized in ED with improvement to 5.5), Glucose 221, Creatinine 1.5, WBC 13.5 and Hgb 10.7.   Significant Hospital Events   5/31 admit  Consults:    Procedures:  ETT 5/31 >  Significant Diagnostic Tests:  HRCT chest 5/31 > honeycombing, subpleural fibrosis craniocaudal gradient, nodular densities scattered with air bronchograms in the bases  Micro Data:  Blood 5/31 >ng Tracheal aspirate 5/31 >ng  Antimicrobials:  Ceftriaxone 5/31 > Azithromycin 5/31 >  Interim history/subjective:   Critically ill, intubated Awake on low-dose propofol 1 L urine output with Lasix   Objective   Blood pressure 124/62, pulse 77, temperature 98.6 F (37 C), resp. rate (!) 21, height 5\' 7"  (1.702 m), weight 90.3 kg, SpO2 98 %.    Vent Mode: PSV;CPAP FiO2 (%):  [40 %] 40 % Set Rate:  [16 bmp] 16 bmp Vt Set:  [530 mL] 530  mL PEEP:  [5 cmH20] 5 cmH20 Pressure Support:  [14 cmH20] 14 cmH20 Plateau Pressure:  [26 cmH20-33 cmH20] 26 cmH20   Intake/Output Summary (Last 24 hours) at 11/02/2019 1136 Last data filed at 11/02/2019 1100 Gross per 24 hour  Intake 793.2 ml  Output 1005 ml  Net -211.8 ml   Filed Weights   10/31/19 2015 11/01/19 0403 11/02/19 0500  Weight: 81.6 kg 92 kg 90.3 kg    Examination: General: elderly Hispanic man, intubated, no distress HENT: [place]/AT, PERRL, no JVD Lungs: Diffuse rales bilateral one third Cardiovascular: RRR, no MRG Abdomen: Soft, non-distended Extremities: No acute deformity. 1+ edema to ankle of BLE Neuro: RASS +1, follows commands, nonfocal  Chest x-ray personally reviewed 6/2 which shows cardiomegaly with bilateral unchanged interstitial infiltrates.  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 Given heraing loss and anemia consider CVID. He also has history of recurrent pulmonary infections including moraxella.  -Acute worsening seems to have been related to fluid versus pneumonia -Tolerating spontaneous breathing trials, getting closer to extubation - Empiric CAP coverage - Send IgG, IgA, IgM, and beta-microglobulin. Check anemia panel, ldh, bilirubin, haptiglobin.  -Never smoker so doubt COPD exacerbation, dc steroids and bronchodilators prn    AKI Hyponatremia  Hyperkalemia , related to acidosis, improving - Follow BMP -40 mg Lasix daily  DM - CBG monitoring and SSI - Hold home metformin -Add 10 units of Lantus while on steroids  Hypotension , due to propofol  rather than sepsis - Hold home metoprolol   Goals of care -gradual worsening described by son over the last year, discussed prognosis especially in lieu of hypercarbia being a terminal event with ILD -Issued limited CODE STATUS  Best practice:  Diet: NPO Pain/Anxiety/Delirium protocol (if indicated): Propofol, PRN fentanyl. RASS goal 0 VAP protocol (if indicated): per protocol DVT prophylaxis: sq heparin GI prophylaxis: PPI Glucose control: SSI Mobility: BR Code Status: FULL Family Communication: son and daughter-in-law at bedside updated Disposition: ICU  The patient is critically ill with multiple organ systems failure and requires high complexity decision making for assessment and support, frequent evaluation and titration of therapies, application of advanced monitoring technologies and extensive interpretation of multiple databases. Critical Care Time devoted to patient care services described in this note independent of APP/resident  time is 31 minutes.    Cyril Mourning MD. Tonny Bollman. [facility]  If no response to pager , please call [phone]   11/02/2019

## 2019-11-02 NOTE — Progress Notes (Signed)
NAME:  Hunter Harvey, MRN:  [id number], DOB:  1940-10-23, LOS: 2 ADMISSION DATE:  10/31/2019, CONSULTATION DATE: 5/31 REFERRING MD: Dr. Juleen China, CHIEF COMPLAINT: Shortness of breath  Brief History   79 year old male with interstitial lung disease admitted 5/31 with acute on chronic hypoxemic and hypercarbic respiratory failure requiring intubation.   History of present illness   Patient is encephalopathic and/or intubated. Therefore history has been obtained from chart review.  79 year old male with PMH significant for DM, interstitial lung disease, rheumatoid arthritis, hearing loss, and recurrent pulmonary infections. He wears 2L of oxygen at home. He was in his usual state of health until 5/31 when he developed progressive shortness of breath and ultimately altered sensorium, which prompted his presentation to [facility] ED. Immediately he was found to be hypoxemic and was escalated to 15L NRB with improvement of his O2 sats. He was eventually initiated on BiPAP. Imaging showed diffuse lung parenchymal abnormality. Laboratory evaluation significant for Na 125, K 7.5 (temporized in ED with improvement to 5.5), Glucose 221, Creatinine 1.5, WBC 13.5 and Hgb 10.7.   Significant Hospital Events   5/31 admit  Consults:    Procedures:  ETT 5/31 >  Significant Diagnostic Tests:  HRCT chest 5/31 > honeycombing, subpleural fibrosis craniocaudal gradient, nodular densities scattered with air bronchograms in the bases  Micro Data:  Blood 5/31 > Tracheal aspirate 5/31 >  Antimicrobials:  Ceftriaxone 5/31 > Azithromycin 5/31 >  Interim history/subjective:   Remains critically ill, intubated Sedation weaned over last 24hrs, pt now awake and able to follow basic commands. Will attempt SBTs today.  Tube feeds initiated yesterday.  Good urine output  Objective   Blood pressure (!) 141/75, pulse 75, temperature 98.6 F (37 C), resp. rate 19, height 5\' 7"  (1.702 m), weight 90.3 kg, SpO2 98  %.    Vent Mode: PRVC FiO2 (%):  [40 %] 40 % Set Rate:  [16 bmp] 16 bmp Vt Set:  [530 mL] 530 mL PEEP:  [5 cmH20] 5 cmH20 Plateau Pressure:  [26 cmH20-33 cmH20] 26 cmH20   Intake/Output Summary (Last 24 hours) at 11/02/2019 0750 Last data filed at 11/02/2019 0600 Gross per 24 hour  Intake 712.25 ml  Output 1005 ml  Net -292.75 ml   Filed Weights   10/31/19 2015 11/01/19 0403 11/02/19 0500  Weight: 81.6 kg 92 kg 90.3 kg    Examination: General: elderly Hispanic man, intubated, follows spontaneously moves all 4 extremities and follows basic commands HENT: [place]/AT, PERRL, no JVD Lungs: Diffuse rales bilateral lower lobes Cardiovascular: RRR, no MRG Abdomen: Soft, non-distended Extremities: No acute deformity. Trace edema to ankle of BLE Neuro: Sedated on propofol, RASS -1 to +1  Resolved Hospital Problem list   AKI Hyperkalemia Hyponatremia  Assessment & Plan:  Acute on chronic hypoxemic respiratory failure: Acute hypercarbic respiratory failure:  He has history of ILD, felt possibly secondary to RA vs NSIP based on notes in care everywhere although HRCT suggests IPF. Given heraing loss and anemia consider CVID. He also has history of recurrent pulmonary infections including moraxella.  -Acute worsening seems to have been related to fluid versus pneumonia -Spontaneous breathing trials today - Continue Empiric CAP coverage, f/u sputum cultures - F/uIgG, IgA, IgM, and beta-microglobulin.  -Never smoker so doubt COPD exacerbation, started on empiric steroids and bronchodilators we will rapidly taper  Macrocytic anemia:  - No hemolytic process indicated in anemia panel. Possible anemia of chronic disease. Will continue to monitor.   AKI, improved  Hyponatremia Hyperkalemia , related to acidosis, improving - Follow BMP - 40 mg Lasix daily  DM - CBG monitoring and SSI - NPO - Hold home metformin - Continue 10 units of Lantus while on steroids  Hypotension - Hold home  metoprolol while on propofol  11/01/19 - Goals of care -gradual worsening described by son over the last year, discuss prognosis especially in lieu of hypercarbia being a terminal event with ILD -Agreeable to limited CODE STATUS  Best practice:  Diet: NPO, Tube feeds Pain/Anxiety/Delirium protocol (if indicated): Propofol, PRN fentanyl. RASS goal 0 VAP protocol (if indicated): per protocol DVT prophylaxis: sq heparin GI prophylaxis: PPI Glucose control: SSI Mobility: BR Code Status: Partial Family Communication: son updated Disposition: ICU  Lorne Skeens, Medical Student

## 2019-11-02 NOTE — Procedures (Signed)
Extubation Procedure Note  Patient Details:   Name: Hunter Harvey DOB: 08/21/1940 MRN: [id number]   Airway Documentation:    Vent end date: 11/02/19 Vent end time: 1209   Evaluation  O2 sats: stable throughout Complications: No apparent complications Patient did tolerate procedure well. Bilateral Breath Sounds: Diminished   Yes   Order received for extubation.  Cuff leak positive prior to extubation.  Patient extubated to NIV 15/5 40%.  Patient able to speak post extubation.  No stridor noted; no complications noted.  Lysbeth Penner [facility] 11/02/2019, 12:10 PM

## 2019-11-03 DIAGNOSIS — Z66 Do not resuscitate: Secondary | ICD-10-CM | POA: Diagnosis not present

## 2019-11-03 DIAGNOSIS — J9622 Acute and chronic respiratory failure with hypercapnia: Secondary | ICD-10-CM | POA: Diagnosis not present

## 2019-11-03 DIAGNOSIS — J9621 Acute and chronic respiratory failure with hypoxia: Secondary | ICD-10-CM | POA: Diagnosis not present

## 2019-11-03 DIAGNOSIS — Z515 Encounter for palliative care: Secondary | ICD-10-CM | POA: Diagnosis not present

## 2019-11-03 DIAGNOSIS — J849 Interstitial pulmonary disease, unspecified: Secondary | ICD-10-CM | POA: Diagnosis not present

## 2019-11-03 LAB — GLUCOSE, CAPILLARY
Glucose-Capillary: 108 mg/dL — ABNORMAL HIGH (ref 70–99)
Glucose-Capillary: 119 mg/dL — ABNORMAL HIGH (ref 70–99)
Glucose-Capillary: 146 mg/dL — ABNORMAL HIGH (ref 70–99)
Glucose-Capillary: 156 mg/dL — ABNORMAL HIGH (ref 70–99)
Glucose-Capillary: 160 mg/dL — ABNORMAL HIGH (ref 70–99)
Glucose-Capillary: 60 mg/dL — ABNORMAL LOW (ref 70–99)
Glucose-Capillary: 60 mg/dL — ABNORMAL LOW (ref 70–99)
Glucose-Capillary: 99 mg/dL (ref 70–99)

## 2019-11-03 LAB — CULTURE, RESPIRATORY W GRAM STAIN
Culture: NORMAL
Gram Stain: NONE SEEN
Special Requests: NORMAL

## 2019-11-03 MED ORDER — ADULT MULTIVITAMIN W/MINERALS CH
1.0000 | ORAL_TABLET | Freq: Every day | ORAL | Status: DC
  Administered 2019-11-03 – 2019-11-05 (×3): 1 via ORAL
  Filled 2019-11-03 (×3): qty 1

## 2019-11-03 MED ORDER — DEXTROSE 50 % IV SOLN
12.5000 g | INTRAVENOUS | Status: AC
  Administered 2019-11-03: 12.5 g via INTRAVENOUS

## 2019-11-03 MED ORDER — ENSURE ENLIVE PO LIQD
237.0000 mL | Freq: Two times a day (BID) | ORAL | Status: DC
  Administered 2019-11-03 – 2019-11-05 (×5): 237 mL via ORAL
  Filled 2019-11-03 (×2): qty 237

## 2019-11-03 MED ORDER — DEXTROSE 50 % IV SOLN
INTRAVENOUS | Status: AC
  Filled 2019-11-03: qty 50

## 2019-11-03 MED ORDER — DEXTROSE 50 % IV SOLN
INTRAVENOUS | Status: AC
  Administered 2019-11-03: 25 g
  Filled 2019-11-03: qty 50

## 2019-11-03 NOTE — Progress Notes (Signed)
PT Cancellation Note  Patient Details Name: Hunter Harvey MRN: [id number] DOB: 23-Sep-1940   Cancelled Treatment:    Reason Eval/Treat Not Completed: Patient not medically ready. RN reports low glucose, will follow up for PT evaluation as appropriate.   Sanjuana Letters SPT 11/03/2019    Sanjuana Letters 11/03/2019, 8:19 AM

## 2019-11-03 NOTE — Progress Notes (Signed)
Inpatient Diabetes Program Recommendations  AACE/ADA: New Consensus Statement on Inpatient Glycemic Control (2015)  Target Ranges:  Prepandial:   less than 140 mg/dL      Peak postprandial:   less than 180 mg/dL (1-2 hours)      Critically ill patients:  140 - 180 mg/dL   Lab Results  Component Value Date   GLUCAP 108 (H) 11/03/2019   HGBA1C 7.9 (H) 10/31/2019    Review of Glycemic Control  Results for LEILAN, BOCHENEK (MRN [id number]) as of 11/03/2019 10:53  Ref. Range 11/02/2019 19:58 11/03/2019 00:13 11/03/2019 04:03 11/03/2019 08:11 11/03/2019 08:52  Glucose-Capillary Latest Ref Range: 70 - 99 mg/dL 67 (L) 703 (H) 60 (L) 60 (L) 108 (H)   Diabetes history:  DM2  Outpatient Diabetes medications:  Metformin 1000 bid  Novolog 36 units tid  Current orders for Inpatient glycemic control:  Novolog 0-15 units q4h   Inpatient Diabetes Program Recommendations:    CBG's trending low.  Tube feeds stopped and started on carb modified diet.  Please consider,  Novolog 0-9 TID with meals   Thank you, Dulce Sellar, RN, BSN Diabetes Coordinator Inpatient Diabetes Program [phone] (team pager from 8a-5p)

## 2019-11-03 NOTE — Plan of Care (Signed)

## 2019-11-03 NOTE — Progress Notes (Signed)
NAME:  Hunter Harvey, MRN:  [id number], DOB:  Apr 07, 1941, LOS: 3 ADMISSION DATE:  10/31/2019, CONSULTATION DATE: 5/31 REFERRING MD: Dr. Wilson Singer, CHIEF COMPLAINT: Shortness of breath  Brief History   79 year old male with interstitial lung disease admitted 5/31 with acute on chronic hypoxemic and hypercarbic respiratory failure requiring intubation.   History of present illness   Patient is encephalopathic and/or intubated. Therefore history has been obtained from chart review.  79 year old male with PMH significant for DM, interstitial lung disease, rheumatoid arthritis, hearing loss, and recurrent pulmonary infections. He wears 2L of oxygen at home. He was in his usual state of health until 5/31 when he developed progressive shortness of breath and ultimately altered sensorium, which prompted his presentation to [facility] ED. Immediately he was found to be hypoxemic and was escalated to 15L NRB with improvement of his O2 sats. He was eventually initiated on BiPAP. Imaging showed diffuse lung parenchymal abnormality. Laboratory evaluation significant for Na 125, K 7.5 (temporized in ED with improvement to 5.5), Glucose 221, Creatinine 1.5, WBC 13.5 and Hgb 10.7.   Significant Hospital Events   5/31 admit  Consults:    Procedures:  ETT 5/31 >  Significant Diagnostic Tests:  HRCT chest 5/31 > honeycombing, subpleural fibrosis craniocaudal gradient, nodular densities scattered with air bronchograms in the bases  Micro Data:  Blood 5/31 >ng Tracheal aspirate 5/31 >ng   Antimicrobials:  Ceftriaxone 5/31 > Azithromycin 5/31 >  Interim history/subjective:   Awake, comfortable on Bi-PAP. Sating in the high 90s on 15/5, 30-40% FiO2.   Pt denies pain.  Had episodes of hypoglycemia overnight which were quickly corrected.   Plan to trail nasal cannula today and initiate diet. Possible transfer out of ICU later this afternoon.    Objective   Blood pressure (!) 147/77, pulse 62,  temperature (!) 97.1 F (36.2 C), temperature source Oral, resp. rate 19, height _0  (1.702 m), weight 88.9 kg, SpO2 96 %.    Vent Mode: BIPAP FiO2 (%):  [30 %-40 %] 30 % Set Rate:  [8 bmp] 8 bmp PEEP:  [5 cmH20] 5 cmH20 Pressure Support:  [8 cmH20] 8 cmH20 Plateau Pressure:  [11 cmH20] 11 cmH20   Intake/Output Summary (Last 24 hours) at 11/03/2019 0937 Last data filed at 11/03/2019 0820 Gross per 24 hour  Intake 614.73 ml  Output 3350 ml  Net -2735.27 ml   Filed Weights   11/01/19 0403 11/02/19 0500 11/03/19 0425  Weight: 92 kg 90.3 kg 88.9 kg    Examination: General: elderly Hispanic man, on Bi-PAP, no distress HENT: [place]ck/AT, PERRL, no JVD Lungs: Diffuse rales bilateral one third. L>R Cardiovascular: RRR, no MRG Abdomen: Soft, non-distended Extremities: No acute deformity. No LE edema Neuro: Alert, alert, follows commands    IgG slightly elevated, IgM and IgA WNL. Beta-2 microglobulin slightly elevated at 2.9.   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 Given heraing loss and anemia consider CVID. He also has history of recurrent pulmonary infections including moraxella.  -Acute worsening seems to have been related to fluid versus pneumonia - Tolerated extubation yesterday and sating in  high 90s on Bi-PAP 15/5 _0 -40 FiO2. Will plan to switch to [place] today _1  and if continues to tolerate will transfer out of ICU  - Empiric CAP coverage - Azithromycin x5 days, CTX x7 days. Cultures neg so far.  - IgG slightly elevated, IgM and IgA WNL. Beta-2 microglobulin slightly elevated at 2.9.   Immunodeficiency less likely. Multiple myeloma possible, but given low-normal serum albumin also less likely. Could consider HIV testing.  -Never smoker so doubt COPD exacerbation, dc steroids and bronchodilators prn    AKI, resolved  Hyponatremia, resolving Hyperkalemia , related to acidosis, improving - Follow BMP - 40 mg Lasix daily - neg 3L yesterday  DM - was hypoglycemic overnight likely due to continuing to be NPO and on bipap now. Pt can resume regular, carb modified diet at this time and we will CTM blood sugar.  - CBG monitoring and SSI - Hold home metformin -Add 10 units of Lantus while on steroids  Hypotension , due to propofol rather than sepsis - Consider restarting home metoprolol now that patient is off sedation. Patient currently normotensive   Goals of care -gradual worsening described by son over the last year, discussed prognosis especially in lieu of hypercarbia being a terminal event with ILD. The patient agreed with a change to DNR code status after extubation yesterday. We have involved palliative care.   Best practice:  Diet: Carb modified diet Pain/Anxiety/Delirium protocol (if indicated): n/a, RASS goal 0 VAP protocol (if indicated): per protocol DVT prophylaxis: sq heparin GI prophylaxis: PPI Glucose control: SSI Mobility: OOB as tolerated Code Status: FULL Family Communication:  Disposition: ICU  Lorne Skeens, Medical Student

## 2019-11-03 NOTE — Progress Notes (Addendum)
Nutrition Follow up  DOCUMENTATION CODES:   Not applicable  INTERVENTION:    Ensure Enlive po BID, each supplement provides 350 kcal and 20 grams of protein  MVI daily   NUTRITION DIAGNOSIS:   Increased nutrient needs related to acute illness as evidenced by estimated needs.  Ongoing  GOAL:   Patient will meet greater than or equal to 90% of their needs   Diet advanced today  MONITOR:   Weight trends, Labs, I & O's, Vent status, Skin, TF tolerance  REASON FOR ASSESSMENT:   Ventilator    ASSESSMENT:   Patient with PMH significant for DM, ILD, rheumatoid arthritis, hearing loss, and recurrent pulmonary infections. Presents this admission with acute on chronic hypoxemic respiratory failure.   6/2- extubated   Pt discussed during ICU rounds and with RN.   Of BiPAP this am. Had hypoglycemic episodes last night and this am. Diet advanced today. RD to provide supplementation to maximize kcal and protein this admission. Suspect improvement in CBG once PO intake increases.   Admission weight: 81.6 kg  Current weight: 88.9 kg   I/O: -2,956 ml since admit  UOP: 3,150 ml x 24 hrs   Medications: colace, SS novolog  Labs: CBG 60-160  Diet Order:   Diet Order            Diet Carb Modified Fluid consistency: Thin; Room service appropriate? Yes  Diet effective now              EDUCATION NEEDS:   Not appropriate for education at this time  Skin:  Skin Assessment: Reviewed RN Assessment  Last BM:  6/3  Height:   Ht Readings from Last 1 Encounters:  10/31/19 5\' 7"  (1.702 m)    Weight:   Wt Readings from Last 1 Encounters:  11/03/19 88.9 kg    BMI:  Body mass index is 30.7 kg/m.  Estimated Nutritional Needs:   Kcal:  2000-2200 kcal  Protein:  100-120 grams  Fluid:  >/= 2 L/day   01/03/20 RD, LDN Clinical Nutrition Pager listed in AMION

## 2019-11-03 NOTE — Evaluation (Signed)
Physical Therapy Evaluation Patient Details Name: Hunter Harvey MRN: [id number] DOB: 1940-09-22 Today's Date: 11/03/2019   History of Present Illness  79 y.o. 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 Intubated 5/31-6/2. PMH DM, interstitial lung disease, rheumatoid arthritis, hearing loss, and recurrent pulmonary infections. He wears 2L of oxygen at home.  Clinical Impression  Pt presents with an overall decrease in functional mobility and decrease in cardiopulmonary endurance secondary to above. PTA, pt lives in mobile home with children. During session Pt required interpretor and was better able to hear with R ear. Today, pt able to sit to stand supervision and amb min guard with eva walker. SpO2 down to 69% on 4L O2, required 8L to regain O2 >/85% with ambulation. Return to 4L with seated rest, pt manitaining >/88%. Pt would benefit from continued acute PT services to maximize functional mobility and independence prior to d/c home.     Follow Up Recommendations No PT follow up    Equipment Recommendations  None recommended by PT    Recommendations for Other Services       Precautions / Restrictions Precautions Precautions: Fall Precaution Comments: Watch o2 Restrictions Weight Bearing Restrictions: No      Mobility  Bed Mobility               General bed mobility comments: Pt in recliner at start of session  Transfers Overall transfer level: Needs assistance Equipment used: 4-wheeled walker(eva) Transfers: Sit to/from Stand Sit to Stand: Supervision         General transfer comment: Pt required supervision sit to stand as this was his first time up with PT  Ambulation/Gait Ambulation/Gait assistance: Min guard Gait Distance (Feet): 240 Feet Assistive device: 4-wheeled walker(eva walker) Gait Pattern/deviations: Step-through pattern;Decreased stride length;Wide base of support Gait velocity:  decreased   General Gait Details: Pt amb 180feet before spo2 desat on 4L [place] to 69%, titrated to 8L and taught pt pursed lip breathing spo2 increased >85% with amb and remained above 88% for rest of amb distance.  Stairs            Wheelchair Mobility    Modified Rankin (Stroke Patients Only)       Balance Overall balance assessment: Needs assistance Sitting-balance support: No upper extremity supported Sitting balance-Leahy Scale: Good Sitting balance - Comments: Pt sitting in recliner and able to reach for objects outside of BOS without difficulty   Standing balance support: No upper extremity supported;During functional activity Standing balance-Leahy Scale: Good Standing balance comment: Pt able to sit to stand supervision. At the end of the session pt stood indepedently to use the urinal without LOB. Will attempt with cane next session                             Pertinent Vitals/Pain Pain Assessment: Faces Faces Pain Scale: Hurts a little bit Pain Location: headache Pain Descriptors / Indicators: Grimacing;Discomfort Pain Intervention(s): Limited activity within patient's tolerance;Monitored during session    [place] expects to be discharged to:: Private residence Living Arrangements: Children Available Help at Discharge: Family Type of Home: Mobile home Home Access: Ramped entrance     Home Layout: One level Home Equipment: Cane - quad;Walker - 2 wheels      Prior Function Level of Independence: Independent with assistive device(s)         Comments: Pt reports using cane at baseline  Hand Dominance        Extremity/Trunk Assessment   Upper Extremity Assessment Upper Extremity Assessment: Overall WFL for tasks assessed    Lower Extremity Assessment Lower Extremity Assessment: Overall WFL for tasks assessed    Cervical / Trunk Assessment Cervical / Trunk Assessment: Kyphotic  Communication   Communication:  Interpreter utilized(Interpreter number [id number])  Cognition Arousal/Alertness: Awake/alert Behavior During Therapy: WFL for tasks assessed/performed Overall Cognitive Status: No family/caregiver present to determine baseline cognitive functioning                                 General Comments: Pt required increased use of tactile and non-verbal directions due to language barrier and difficulty hearing. Pt cognition appeared WNL for tasks, but no family present to determine.      General Comments General comments (skin integrity, edema, etc.): no abnormal skin integument observed    Exercises     Assessment/Plan    PT Assessment Patient needs continued PT services  PT Problem List Decreased mobility;Cardiopulmonary status limiting activity       PT Treatment Interventions DME instruction;Therapeutic activities;Gait training;Therapeutic exercise;Patient/family education;Balance training;Functional mobility training    PT Goals (Current goals can be found in the Care Plan section)  Acute Rehab PT Goals Patient Stated Goal: Home PT Goal Formulation: With patient Time For Goal Achievement: 11/17/19 Potential to Achieve Goals: Good    Frequency Min 3X/week   Barriers to discharge   no barriers to d/c    Co-evaluation               AM-PAC PT "6 Clicks" Mobility  Outcome Measure Help needed turning from your back to your side while in a flat bed without using bedrails?: None Help needed moving from lying on your back to sitting on the side of a flat bed without using bedrails?: None Help needed moving to and from a bed to a chair (including a wheelchair)?: A Little Help needed standing up from a chair using your arms (e.g., wheelchair or bedside chair)?: None Help needed to walk in hospital room?: A Little Help needed climbing 3-5 steps with a railing? : A Little 6 Click Score: 21    End of Session Equipment Utilized During Treatment: Gait  belt;Oxygen Activity Tolerance: Other (comment)(limited by decreased cardiopulmonary endurance) Patient left: in chair;with call bell/phone within reach Nurse Communication: Mobility status PT Visit Diagnosis: Other abnormalities of gait and mobility (R26.89);Other (comment)(decreased cardiopulmonary endurance.)    Time: 5852-7782 PT Time Calculation (min) (ACUTE ONLY): 28 min   Charges:   PT Evaluation $PT Eval Moderate Complexity: 1 Mod PT Treatments $Therapeutic Exercise: 8-22 mins        [employer] SPT 11/03/2019   Sanjuana Letters 11/03/2019, 5:04 PM

## 2019-11-03 NOTE — Consult Note (Signed)
Consultation Note Date: 11/03/2019   Patient Name: Hunter Harvey  DOB: 1940/10/10  MRN: [id number]  Age / Sex: 79 y.o., male  PCP: Rocky Morel, MD Referring Physician: Rigoberto Noel, MD  Reason for Consultation: Establishing goals of care and Psychosocial/spiritual support  HPI/Patient Profile: 80 y.o. male   admitted on 10/31/2019 with interstitial lung disease,  with acute on chronic hypoxemic and hypercarbic respiratory failure requiring intubation.  Patient is Spanish-speaking.  PMH significant for DM, interstitial lung disease, rheumatoid arthritis, hearing loss, and recurrent pulmonary infections. He wears 2L of oxygen at home. He was in his usual state of health until 5/31 when he developed progressive shortness of breath and ultimately altered sensorium, which prompted his presentation to [facility] ED. Immediately he was found to be hypoxemic and was escalated to 15L NRB with improvement of his O2 sats. He was eventually initiated on BiPAP. Imaging showed diffuse lung parenchymal abnormality. Laboratory evaluation significant for Na 125, K 7.5 (temporized in ED with improvement to 5.5), Glucose 221, Creatinine 1.5, WBC 13.5 and Hgb 10.7.  He required intubation.  Patient lives with his son and daughter-in-law here in [place].  Primary healthcare previous to this admission was in the [place] area according to son, however he verbalizes a desire to secure follow-up care locally here in [place] for patient's health and wellness into the future; both primary and pulmonary.  Patient was extubated 11-02-19.  Today patient is alert and oriented.  Stables out of bed to chair.  He walked in hallway with nursing and tolerated well.  Patient is high risk for decompensation  Patient and family face treatment option decisions, advanced directive decisions and anticipatory care needs.  Clinical  Assessment and Goals of Care:   This NP Wadie Lessen reviewed medical records, received report from team, assessed the patient and then meet at the patient's bedside with son/Julio Gonzales  to discuss diagnosis, prognosis, GOC, EOL wishes disposition and options.   Concept of Palliative Care was introduced as specialized medical care for people and their families living with serious illness.  It focuses on providing relief from the symptoms and stress of a serious illness.  The goal is to improve quality of life for both the patient and the family.  Created space and opportunity for patient and his son to explore their thoughts and feelings regarding current medical situation.  It was very difficult for patient to participate in conversation secondary to his hearing loss and not having hearing aids with him.  We could not use interpreter robot/machine.  The majority of today's conversation was had with the son.  I was able to have patient confirm that his son is his main support and Media planner.     A  discussion was had today regarding advanced directives.  Concepts specific to code status, artifical feeding and hydration, continued IV antibiotics and rehospitalization was had.  The difference between a aggressive medical intervention path  and a palliative comfort care path for this patient at this time  was had.  Values and goals of care important to patient and family were attempted to be elicited.   MOST form introduced.    Patient and family understand the seriousness of his medical situation.  They remain hopeful for improvement and are open to all offered and available treatments to improve his current health and wellness. Hope is for discharge home with family.  They are open to home health services and assistance in the home.   Questions and concerns addressed.  Patient  encouraged to call with questions or concerns.     PMT will continue to support holistically.       No  documented healthcare power of attorney or advanced directive. Offered assistance from  spiritual care department to secure those documents while he is hospitalized.  Family will consider  At this time patient is able to make his own decisions and his son is his main support person.     SUMMARY OF RECOMMENDATIONS    Code Status/Advance Care Planning:  DNR-previously documented    Palliative Prophylaxis:   Aspiration, Bowel Regimen, Delirium Protocol, Frequent Pain Assessment and Oral Care  Additional Recommendations (Limitations, Scope, Preferences):  Full Scope Treatment  Psycho-social/Spiritual:   Desire for further Chaplaincy support:no  Additional Recommendations: Education on Hospice and community based palliative care services.    Prognosis:   Unable to determine  Discharge Planning: To Be Determined      Primary Diagnoses: Present on Admission: . Acute respiratory failure (HCC)   I have reviewed the medical record, interviewed the patient and family, and examined the patient. The following aspects are pertinent.  Past Medical History:  Diagnosis Date  . Diabetes (HCC)   . Hyperlipidemia    Social History   Socioeconomic History  . Marital status: Married    Spouse name: Not on file  . Number of children: Not on file  . Years of education: Not on file  . Highest education level: Not on file  Occupational History  . Not on file  Tobacco Use  . Smoking status: Never Smoker  . Smokeless tobacco: Never Used  Substance and Sexual Activity  . Alcohol use: Not Currently  . Drug use: Never  . Sexual activity: Not on file  Other Topics Concern  . Not on file  Social History Narrative  . Not on file   Social Determinants of Health   Financial Resource Strain:   . Difficulty of Paying Living Expenses:   Food Insecurity:   . Worried About Programme researcher, broadcasting/film/video in the Last Year:   . Barista in the Last Year:   Transportation Needs:   . Sales promotion account executive (Medical):   Marland Kitchen Lack of Transportation (Non-Medical):   Physical Activity:   . Days of Exercise per Week:   . Minutes of Exercise per Session:   Stress:   . Feeling of Stress :   Social Connections:   . Frequency of Communication with Friends and Family:   . Frequency of Social Gatherings with Friends and Family:   . Attends Religious Services:   . Active Member of Clubs or Organizations:   . Attends Banker Meetings:   Marland Kitchen Marital Status:    History reviewed. No pertinent family history. Scheduled Meds: . chlorhexidine  15 mL Mouth Rinse BID  . Chlorhexidine Gluconate Cloth  6 each Topical Daily  . dextrose      . docusate  100 mg Per Tube BID  . heparin  5,000 Units Subcutaneous Q8H  . insulin aspart  0-15 Units Subcutaneous Q4H  . mouth rinse  15 mL Mouth Rinse q12n4p   Continuous Infusions: . sodium chloride 10 mL/hr at 11/03/19 0600  . albuterol 15 mg/hr (10/31/19 1649)  . azithromycin Stopped (11/02/19 1323)  . cefTRIAXone (ROCEPHIN)  IV 2 g (11/03/19 0927)   PRN Meds:.sodium chloride Medications Prior to Admission:  Prior to Admission medications   Medication Sig Start Date End Date Taking? Authorizing Provider  Accu-Chek FastClix Lancets MISC 1 each by Other route daily. 09/06/19  Yes [provider]  albuterol (PROVENTIL HFA;VENTOLIN HFA) 108 (90 Base) MCG/ACT inhaler Inhale 2 puffs into the lungs every 4 (four) hours as needed for wheezing or shortness of breath.    Yes [provider]  B-D ULTRAFINE III SHORT PEN 31G X 8 MM MISC 1 each by Other route daily. 08/08/19  Yes [provider]  ibuprofen (ADVIL) 800 MG tablet Take 800 mg by mouth every 8 (eight) hours as needed for pain. 06/08/19  Yes [provider]  isosorbide mononitrate (IMDUR) 30 MG 24 hr tablet Take 30 mg by mouth daily.   Yes [provider]  metFORMIN (GLUCOPHAGE) 1000 MG tablet Take 1,000 mg by mouth in the morning and at bedtime.     Yes [provider]  NOVOLOG 100 UNIT/ML injection Inject 36 Units into the skin 3 (three) times daily with meals.  10/07/19  Yes [provider]  omeprazole (PRILOSEC) 40 MG capsule Take 40 mg by mouth daily.   Yes [provider]  [facility] ULTRA test strip 1 each by Other route daily. 09/06/19  Yes [provider]  predniSONE (DELTASONE) 10 MG tablet Take 10 mg by mouth daily. 07/20/19  Yes [provider]  STIOLTO RESPIMAT 2.5-2.5 MCG/ACT AERS Take 2 puffs by mouth daily. 10/24/19  Yes [provider]  sulfaSALAzine (AZULFIDINE) 500 MG tablet Take 500 mg by mouth 3 (three) times daily.   Yes [provider]  tamsulosin (FLOMAX) 0.4 MG CAPS capsule Take 0.4 mg by mouth daily.   Yes [provider]  Vitamin D, Ergocalciferol, (DRISDOL) 1.25 MG (50000 UNIT) CAPS capsule Take 50,000 Units by mouth once a week. 09/06/19  Yes [provider]  atorvastatin (LIPITOR) 20 MG tablet Take 40 mg by mouth daily.     [provider]  ipratropium (ATROVENT) 0.02 % nebulizer solution Inhale 2.5 mLs into the lungs in the morning, at noon, in the evening, and at bedtime. 07/14/18   [provider]  metoprolol tartrate (LOPRESSOR) 50 MG tablet Take 1 tablet (50 mg total) by mouth once for 1 dose. TAKE 2 HOURS PRIOR TO YOUR CORONARY CTA 11/14/18 11/14/18  Runell Gess, MD   No Known Allergies Review of Systems  Neurological: Positive for weakness.    Physical Exam Cardiovascular:     Rate and Rhythm: Normal rate.  Pulmonary:     Effort: Pulmonary effort is normal.  Skin:    General: Skin is warm and dry.  Neurological:     Mental Status: He is alert.     Vital Signs: BP (!) 154/80   Pulse 84   Temp (!) 97.1 F (36.2 C) (Oral)   Resp 19   Ht 5\' 7"  (1.702 m)   Wt 88.9 kg   SpO2 99%   BMI 30.70 kg/m  Pain Scale: 0-10   Pain Score: 3    SpO2: SpO2: 99 % O2 Device:SpO2: 99 %  O2 Flow Rate: .O2 Flow  Rate (L/min): 5 L/min  IO: Intake/output summary:   Intake/Output Summary (Last 24 hours) at 11/03/2019 1017 Last data filed at 11/03/2019 0820 Gross per 24 hour  Intake 481.16 ml  Output 3350 ml  Net -2868.84 ml    LBM: Last BM Date: (PTA) Baseline Weight: Weight: 81.6 kg Most recent weight: Weight: 88.9 kg     Palliative Assessment/Data: 40 %   Discussed with Dr. Vassie Loll via secure chat and LCSW  Time In: 1200 Time Out: 1310 Time Total: 70 minutes Greater than 50%  of this time was spent counseling and coordinating care related to the above assessment and plan.  Signed by: Lorinda Creed, NP   Please contact Palliative Medicine Team phone at [phone] for questions and concerns.  For individual provider: See Loretha Stapler

## 2019-11-03 NOTE — Progress Notes (Signed)
Hypoglycemic Event  CBG: 60  Treatment: 12.5mg  D50  Symptoms: none  Follow-up CBG: Time:0850 CBG Result:108  Possible Reasons for Event: NPO.  Comments/MD notified:MD rounding; will notify    Delorse Lek

## 2019-11-04 ENCOUNTER — Inpatient Hospital Stay (HOSPITAL_COMMUNITY): Payer: Medicare Other

## 2019-11-04 DIAGNOSIS — J9621 Acute and chronic respiratory failure with hypoxia: Secondary | ICD-10-CM | POA: Diagnosis not present

## 2019-11-04 DIAGNOSIS — J9601 Acute respiratory failure with hypoxia: Secondary | ICD-10-CM | POA: Diagnosis not present

## 2019-11-04 DIAGNOSIS — Z66 Do not resuscitate: Secondary | ICD-10-CM

## 2019-11-04 DIAGNOSIS — J849 Interstitial pulmonary disease, unspecified: Secondary | ICD-10-CM | POA: Diagnosis not present

## 2019-11-04 LAB — CBC
HCT: 33.1 % — ABNORMAL LOW (ref 39.0–52.0)
Hemoglobin: 10 g/dL — ABNORMAL LOW (ref 13.0–17.0)
MCH: 29.5 pg (ref 26.0–34.0)
MCHC: 30.2 g/dL (ref 30.0–36.0)
MCV: 97.6 fL (ref 80.0–100.0)
Platelets: 347 10*3/uL (ref 150–400)
RBC: 3.39 MIL/uL — ABNORMAL LOW (ref 4.22–5.81)
RDW: 14.6 % (ref 11.5–15.5)
WBC: 10.4 10*3/uL (ref 4.0–10.5)
nRBC: 0 % (ref 0.0–0.2)

## 2019-11-04 LAB — GLUCOSE, CAPILLARY
Glucose-Capillary: 115 mg/dL — ABNORMAL HIGH (ref 70–99)
Glucose-Capillary: 115 mg/dL — ABNORMAL HIGH (ref 70–99)
Glucose-Capillary: 124 mg/dL — ABNORMAL HIGH (ref 70–99)
Glucose-Capillary: 141 mg/dL — ABNORMAL HIGH (ref 70–99)
Glucose-Capillary: 170 mg/dL — ABNORMAL HIGH (ref 70–99)
Glucose-Capillary: 279 mg/dL — ABNORMAL HIGH (ref 70–99)

## 2019-11-04 LAB — BASIC METABOLIC PANEL
Anion gap: 9 (ref 5–15)
BUN: 18 mg/dL (ref 8–23)
CO2: 35 mmol/L — ABNORMAL HIGH (ref 22–32)
Calcium: 8.3 mg/dL — ABNORMAL LOW (ref 8.9–10.3)
Chloride: 96 mmol/L — ABNORMAL LOW (ref 98–111)
Creatinine, Ser: 0.77 mg/dL (ref 0.61–1.24)
GFR calc Af Amer: >60 mL/min (ref >60)
GFR calc non Af Amer: >60 mL/min (ref >60)
Glucose, Bld: 126 mg/dL — ABNORMAL HIGH (ref 70–99)
Potassium: 4.3 mmol/L (ref 3.5–5.1)
Sodium: 140 mmol/L (ref 135–145)

## 2019-11-04 LAB — MAGNESIUM: Magnesium: 1.7 mg/dL (ref 1.7–2.4)

## 2019-11-04 LAB — PHOSPHORUS: Phosphorus: 3.5 mg/dL (ref 2.5–4.6)

## 2019-11-04 MED ORDER — GUAIFENESIN ER 600 MG PO TB12
600.0000 mg | ORAL_TABLET | Freq: Two times a day (BID) | ORAL | Status: DC
  Administered 2019-11-04 – 2019-11-05 (×3): 600 mg via ORAL
  Filled 2019-11-04 (×4): qty 1

## 2019-11-04 MED ORDER — CEFDINIR 300 MG PO CAPS
300.0000 mg | ORAL_CAPSULE | Freq: Two times a day (BID) | ORAL | Status: DC
  Administered 2019-11-04 – 2019-11-05 (×3): 300 mg via ORAL
  Filled 2019-11-04 (×4): qty 1

## 2019-11-04 MED ORDER — GUAIFENESIN ER 600 MG PO TB12: 600.0000 mg | ORAL_TABLET | Freq: Two times a day (BID) | ORAL | 0 refills | Status: AC

## 2019-11-04 MED ORDER — ALPRAZOLAM 0.25 MG PO TABS: 0.2500 mg | ORAL_TABLET | Freq: Three times a day (TID) | ORAL | 0 refills | Status: AC | PRN

## 2019-11-04 MED ORDER — ALPRAZOLAM 0.25 MG PO TABS
0.2500 mg | ORAL_TABLET | Freq: Three times a day (TID) | ORAL | Status: DC | PRN
  Administered 2019-11-04 – 2019-11-05 (×2): 0.25 mg via ORAL
  Filled 2019-11-04 (×2): qty 1

## 2019-11-04 MED ORDER — CEFDINIR 300 MG PO CAPS: 300.0000 mg | ORAL_CAPSULE | Freq: Two times a day (BID) | ORAL | 0 refills | Status: AC

## 2019-11-04 NOTE — Progress Notes (Signed)
Physician Discharge Summary  Patient ID: Hunter Harvey MRN: [id number] DOB/AGE: August 29, 1940 79 y.o.  Admit date: 10/31/2019 Discharge date: 11/04/2019   Discharge Diagnoses: Acute on chronic hypoxemic respiratory failure                                                  DISCHARGE PLAN BY DIAGNOSIS   Hypoxemic respiratory failure-continue oxygen supplementation keep saturations as close to 90 as possible, may titrate oxygen to achieve this Possible pneumonia-complete course of antibiotics-to complete Ceftin Interstitial lung disease -Supportive measures, continue prednisone that he was on at home Diabetes Continue home medications   Discharge Plan: Discharge home    DISCHARGE SUMMARY  Patient was admitted to the hospital with progressive shortness of breath Altered mentation Intubated for respiratory failure, progressively improved and was successfully extubated.  Started on BiPAP. Treated for pneumonia, oxygen requirement continues to improve. Stable enough to be considered for discharge           SIGNIFICANT DIAGNOSTIC STUDIES CT scan 10/31/2019 significant for multifocal infiltrates  MICRO DATA  Blood cultures 10/30/2019 was negative for any organism Tracheal cultures negative for any organism  ANTIBIOTICS Treated with azithromycin and ceftriaxone Transition to [facility]  CONSULTS None  TUBES / LINES None   Discharge Exam: General: Elderly gentleman, does not appear to be in distress Neuro: Moving all extremities with no focal findings CV: S1-S2 appreciated PULM: Bibasal rales GI: Bowel sounds appreciated Extremities: No clubbing  Vitals:   11/03/19 2308 11/04/19 0516 11/04/19 0810 11/04/19 1329  BP: (!) 144/66  136/75 133/79  Pulse: 87  90 95  Resp: 17  19 20   Temp: 97.9 F (36.6 C)  98.6 F (37 C) 97.9 F (36.6 C)  TempSrc: Oral     SpO2: 92%  100% 100%  Weight:  89.4 kg    Height:         Discharge Labs  BMET Recent Labs  Lab 10/31/19 1839  10/31/19 1934 10/31/19 2127 10/31/19 2149 10/31/19 2215 10/31/19 2215 11/01/19 0103 11/01/19 0103 11/01/19 0225 11/01/19 0225 11/02/19 0624 11/04/19 0234  NA 125*   < >  --    < > 125*  --  126*  --  128*  --  132* 140  K 5.5*   < >  --    < > 5.6*   < > 5.3*   < > 5.3*   < > 4.6 4.3  CL 90*  --   --   --  89*  --  88*  --   --   --  92* 96*  CO2 31  --   --   --  28  --  29  --   --   --  32 35*  GLUCOSE 275*  --   --   --  264*  --  259*  --   --   --  118* 126*  BUN 40*  --   --   --  41*  --  40*  --   --   --  45* 18  CREATININE 1.42*   < > 1.49*  --  1.50*  --  1.43*  --   --   --  0.95 0.77  CALCIUM 8.0*  --   --   --  8.0*  --  7.8*  --   --   --  8.1* 8.3*  MG  --   --   --   --   --   --  1.9  --   --   --  2.1 1.7  PHOS  --   --   --   --   --   --  5.1*  --   --   --  3.8 3.5   < > = values in this interval not displayed.    CBC Recent Labs  Lab 11/01/19 0103 11/01/19 0103 11/01/19 0225 11/02/19 0624 11/04/19 0234  HGB 9.2*   < > 11.9* 9.5* 10.0*  HCT 29.4*   < > 35.0* 29.6* 33.1*  WBC 8.9  --   --  10.9* 10.4  PLT 222  --   --  305 347   < > = values in this interval not displayed.    Anti-Coagulation No results for input(s): INR in the last 168 hours.  Discharge Instructions    Diet - low sodium heart healthy   Complete by: As directed    Increase activity slowly   Complete by: As directed        Follow-up Information    Rigoberto Noel, MD Follow up in 5 day(s).   Specialty: Pulmonary Disease Contact information: [address] [place] [address] [phone]            Allergies as of 11/04/2019   No Known Allergies     Medication List    STOP taking these medications   albuterol 108 (90 Base) MCG/ACT inhaler Commonly known as: VENTOLIN HFA     TAKE these medications   Accu-Chek FastClix Lancets Misc 1 each by Other route daily.   ALPRAZolam 0.25 MG tablet Commonly known as: XANAX Take 1 tablet (0.25 mg  total) by mouth 3 (three) times daily as needed for anxiety.   atorvastatin 20 MG tablet Commonly known as: LIPITOR Take 40 mg by mouth daily.   B-D ULTRAFINE III SHORT PEN 31G X 8 MM Misc Generic drug: Insulin Pen Needle 1 each by Other route daily.   cefdinir 300 MG capsule Commonly known as: OMNICEF Take 1 capsule (300 mg total) by mouth every 12 (twelve) hours.   guaiFENesin 600 MG 12 hr tablet Commonly known as: MUCINEX Take 1 tablet (600 mg total) by mouth 2 (two) times daily.   ibuprofen 800 MG tablet Commonly known as: ADVIL Take 800 mg by mouth every 8 (eight) hours as needed for pain.   ipratropium 0.02 % nebulizer solution Commonly known as: ATROVENT Inhale 2.5 mLs into the lungs in the morning, at noon, in the evening, and at bedtime.   isosorbide mononitrate 30 MG 24 hr tablet Commonly known as: IMDUR Take 30 mg by mouth daily.   metFORMIN 1000 MG tablet Commonly known as: GLUCOPHAGE Take 1,000 mg by mouth in the morning and at bedtime.   metoprolol tartrate 50 MG tablet Commonly known as: LOPRESSOR Take 1 tablet (50 mg total) by mouth once for 1 dose. TAKE 2 HOURS PRIOR TO YOUR CORONARY CTA   NovoLOG 100 UNIT/ML injection Generic drug: insulin aspart Inject 36 Units into the skin 3 (three) times daily with meals.   omeprazole 40 MG capsule Commonly known as: PRILOSEC Take 40 mg by mouth daily.   OneTouch Ultra test strip Generic drug: glucose blood 1 each by Other route daily.   predniSONE 10 MG tablet Commonly known as: DELTASONE Take 10 mg by mouth daily.  Stiolto Respimat 2.5-2.5 MCG/ACT Aers Generic drug: Tiotropium Bromide-Olodaterol Take 2 puffs by mouth daily.   sulfaSALAzine 500 MG tablet Commonly known as: AZULFIDINE Take 500 mg by mouth 3 (three) times daily.   tamsulosin 0.4 MG Caps capsule Commonly known as: FLOMAX Take 0.4 mg by mouth daily.   Vitamin D (Ergocalciferol) 1.25 MG (50000 UNIT) Caps capsule Commonly known as:  DRISDOL Take 50,000 Units by mouth once a week.            Durable Medical Equipment  (From admission, onward)         Start     Ordered   11/04/19 1453  DME Oxygen  Once    Question Answer Comment  Length of Need Lifetime   Liters per Minute 5   Frequency Continuous (stationary and portable oxygen unit needed)   Oxygen conserving device No   Oxygen delivery system Gas      11/04/19 1452            Disposition:   Discharged Condition: Hunter Harvey has met maximum benefit of inpatient care and is medically stable and cleared for discharge.  Patient is pending follow up as above.      Time spent on disposition:  32 Minutes.   Signed: Sherrilyn Rist, MD [facility] [phone]

## 2019-11-04 NOTE — Progress Notes (Signed)
Physical Therapy Treatment Patient Details Name: Hunter Harvey MRN: [id number] DOB: 03-04-1941 Today's Date: 11/04/2019    History of Present Illness 79 y.o. 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 Intubated 5/31-6/2. PMH DM, interstitial lung disease, rheumatoid arthritis, hearing loss, and recurrent pulmonary infections. He wears 5L of oxygen at home (per daughter)    PT Comments    Pt demonstrating gradual progress.  His O2 sats do continue to decrease quickly/suddenly with longer distance ambulation, but are stable for shorter distances.  Pt on 5 LPM at rest and ambulated on 6 LPM (5 not option on tanks).  Pt did have increased forward momentum without use of AD - continue to recommend RW at this time but may be able to progress to cane.    Follow Up Recommendations  No PT follow up     Equipment Recommendations  None recommended by PT    Recommendations for Other Services       Precautions / Restrictions Precautions Precautions: Fall Precaution Comments: Watch o2    Mobility  Bed Mobility Overal bed mobility: Needs Assistance Bed Mobility: Supine to Sit;Sit to Supine     Supine to sit: Supervision Sit to supine: Supervision      Transfers Overall transfer level: Needs assistance Equipment used: Rolling walker (2 wheeled) Transfers: Sit to/from Stand Sit to Stand: Supervision         General transfer comment: performed x 2  Ambulation/Gait Ambulation/Gait assistance: Min guard Gait Distance (Feet): 200 Feet(200' then 100') Assistive device: Rolling walker (2 wheeled) Gait Pattern/deviations: Step-through pattern;Trunk flexed;Drifts right/left Gait velocity: decreased   General Gait Details: Pt ambulated on 6 L O2 with sats initially 95%.  After ~150' sats began to decrease to 81% and pt demonstrating decreased stability/increased forward momentum.  Had pt stop and pursed lip breath with  sats returning to 85% for remainder of 200'.  Took 5 min rest break and then ambulated 100' with RW for improved balance and sats maintained 90% on 6 LPM O2.   Stairs             Wheelchair Mobility    Modified Rankin (Stroke Patients Only)       Balance Overall balance assessment: Needs assistance Sitting-balance support: No upper extremity supported Sitting balance-Leahy Scale: Good     Standing balance support: No upper extremity supported;During functional activity Standing balance-Leahy Scale: Fair Standing balance comment: Static: pt able to stand to use urinal without LOB.  Dynamic: initially good but with fatigue pt with anterior lean/forward momentum requiring assist                            Cognition Arousal/Alertness: Awake/alert Behavior During Therapy: WFL for tasks assessed/performed Overall Cognitive Status: Within Functional Limits for tasks assessed                                 General Comments: Pt HOH - has had difficulty hearing video intrepretor, daughter intrepreted      Exercises      General Comments General comments (skin integrity, edema, etc.): Pt on 5 LPM at arrival with sats 100%.  See ambulation for sats with ambulation.  Additionally, per eval pt on 2 LPM home O2 ; however, daughter reports today pt on 5 LPM O2 at home.      Pertinent Vitals/Pain  Pain Assessment: No/denies pain    Home Living                      Prior Function            PT Goals (current goals can now be found in the care plan section) Progress towards PT goals: Progressing toward goals    Frequency           PT Plan Current plan remains appropriate    Co-evaluation              AM-PAC PT "6 Clicks" Mobility   Outcome Measure  Help needed turning from your back to your side while in a flat bed without using bedrails?: None Help needed moving from lying on your back to sitting on the side of a flat bed  without using bedrails?: None Help needed moving to and from a bed to a chair (including a wheelchair)?: None Help needed standing up from a chair using your arms (e.g., wheelchair or bedside chair)?: None Help needed to walk in hospital room?: A Little Help needed climbing 3-5 steps with a railing? : A Little 6 Click Score: 22    End of Session Equipment Utilized During Treatment: Gait belt;Oxygen Activity Tolerance: Patient limited by fatigue Patient left: with call bell/phone within reach;in bed;with bed alarm set;with family/visitor present Nurse Communication: Mobility status PT Visit Diagnosis: Other abnormalities of gait and mobility (R26.89);Other (comment)     Time: 1035-1100 PT Time Calculation (min) (ACUTE ONLY): 25 min  Charges:  $Gait Training: 8-22 mins $Therapeutic Activity: 8-22 mins                     Royetta Asal, PT Acute Rehab Services Pager [phone] [facility] [phone] [facility] [phone]    Rayetta Humphrey 11/04/2019, 11:39 AM

## 2019-11-04 NOTE — Progress Notes (Signed)
Checked up on the patient again  He has some anxiety  We will prescribe anxiolytic  Reached out to the son, he will be able to pick him up later today  I will put in discharge papers  Arrangements already made for him to follow-up in the pulmonary office with Dr. Vassie Loll

## 2019-11-04 NOTE — Progress Notes (Addendum)
   NAME:  Hunter Harvey, MRN:  [id number], DOB:  09-01-1940, LOS: 4 ADMISSION DATE:  10/31/2019, CONSULTATION DATE: 5/31 REFERRING MD: Dr. Juleen China, CHIEF COMPLAINT: Shortness of breath  Brief History   79 year old male with interstitial lung disease admitted 5/31 with acute on chronic hypoxemic and hypercarbic respiratory failure requiring intubation.   History of present illness   Patient is encephalopathic and/or intubated. Therefore history has been obtained from chart review.  79 year old male with PMH significant for DM, interstitial lung disease, rheumatoid arthritis, hearing loss, and recurrent pulmonary infections. He wears 2L of oxygen at home. He was in his usual state of health until 5/31 when he developed progressive shortness of breath and ultimately altered sensorium, which prompted his presentation to [facility] ED. Immediately he was found to be hypoxemic and was escalated to 15L NRB with improvement of his O2 sats. He was eventually initiated on BiPAP. Imaging showed diffuse lung parenchymal abnormality. Laboratory evaluation significant for Na 125, K 7.5 (temporized in ED with improvement to 5.5), Glucose 221, Creatinine 1.5, WBC 13.5 and Hgb 10.7.   Significant Hospital Events   5/31 admit  Consults:    Procedures:  ETT 5/31 >  Significant Diagnostic Tests:  HRCT chest 5/31 > honeycombing, subpleural fibrosis craniocaudal gradient, nodular densities scattered with air bronchograms in the bases  Micro Data:  Blood 5/31 >ng Tracheal aspirate 5/31 >ng   Antimicrobials:  Ceftriaxone 5/31 > Azithromycin 5/31 >  Interim history/subjective:   Elderly gentleman Appears comfortable Denies any pain or discomfort Has a cough with minimal expectoration  Objective   Blood pressure (!) 144/66, pulse 87, temperature 97.9 F (36.6 C), temperature source Oral, resp. rate 17, height 5\' 7"  (1.702 m), weight 89.4 kg, SpO2 92 %.    Vent Mode: BIPAP FiO2 (%):  [30 %] 30 % Set  Rate:  [8 bmp] 8 bmp PEEP:  [5 cmH20] 5 cmH20   Intake/Output Summary (Last 24 hours) at 11/04/2019 0746 Last data filed at 11/03/2019 2000 Gross per 24 hour  Intake 414.83 ml  Output 350 ml  Net 64.83 ml   Filed Weights   11/02/19 0500 11/03/19 0425 11/04/19 0516  Weight: 90.3 kg 88.9 kg 89.4 kg    Examination: General: Elderly, on 5 L nasal cannula  HENT: Moist oral mucosa Lungs: Rales bibasilarly Cardiovascular: RRR, no MRG Abdomen: Soft, non-distended Extremities: No acute deformity. No LE edema Neuro: Alert, alert, follows commands    IgG slightly elevated, IgM and IgA WNL. Beta-2 microglobulin slightly elevated at 2.9.   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  History of recurrent pulmonary infections Acute worsening consistent with possible infectious process-this is improving Complete empiric antibiotic coverage Add Mucinex for cough  Acute kidney injury Metabolic derangements-resolving   DM  -SSI -Resume usual medications  Patient is DNR Follow-up as outpatient  I believe the oxygen requirement at present is close to his baseline Discharge planning  Best practice:  Diet: Carb modified diet Pain/Anxiety/Delirium protocol (if indicated): n/a, RASS goal 0 VAP protocol (if indicated): per protocol DVT prophylaxis: sq heparin GI prophylaxis: PPI Glucose control: SSI Mobility: OOB as tolerated Code Status: FULL Family Communication:  Disposition: ICU  01/04/20, MD [place] [facility] Pager: [phone]

## 2019-11-05 ENCOUNTER — Other Ambulatory Visit: Payer: Self-pay | Admitting: Pulmonary Disease

## 2019-11-05 DIAGNOSIS — J9601 Acute respiratory failure with hypoxia: Secondary | ICD-10-CM | POA: Diagnosis not present

## 2019-11-05 DIAGNOSIS — Z66 Do not resuscitate: Secondary | ICD-10-CM | POA: Diagnosis not present

## 2019-11-05 DIAGNOSIS — Z515 Encounter for palliative care: Secondary | ICD-10-CM

## 2019-11-05 DIAGNOSIS — J9621 Acute and chronic respiratory failure with hypoxia: Secondary | ICD-10-CM | POA: Diagnosis not present

## 2019-11-05 DIAGNOSIS — J849 Interstitial pulmonary disease, unspecified: Secondary | ICD-10-CM | POA: Diagnosis not present

## 2019-11-05 LAB — CULTURE, BLOOD (ROUTINE X 2)
Culture: NO GROWTH
Culture: NO GROWTH
Special Requests: ADEQUATE

## 2019-11-05 LAB — GLUCOSE, CAPILLARY
Glucose-Capillary: 91 mg/dL (ref 70–99)
Glucose-Capillary: 133 mg/dL — ABNORMAL HIGH (ref 70–99)
Glucose-Capillary: 109 mg/dL — ABNORMAL HIGH (ref 70–99)
Glucose-Capillary: 167 mg/dL — ABNORMAL HIGH (ref 70–99)

## 2019-11-05 MED ORDER — ALBUTEROL SULFATE (2.5 MG/3ML) 0.083% IN NEBU
2.5000 mg | INHALATION_SOLUTION | Freq: Four times a day (QID) | RESPIRATORY_TRACT | 12 refills | Status: DC | PRN
Start: 2019-11-05 — End: 2019-11-05

## 2019-11-05 NOTE — Care Management (Addendum)
09:00 Patient from home w son and DIL.  Per H&P patient wears 2L O2 at home, O2 order at DC for 5L, patient will need ambulatory sats documented, this has been communicated to nurse. LVM with son to return call to discuss DC planning, potential palliative or hospice involvement. Patient will need O2 for transport. 10:00 Spoke w patient's son. He states that patient has Oxygen through [facility], I will submit new O2 order to them so they will be able to provide adequate transport tanks to family.  They are currently prescribed 2L but have been titrating up at MD suggestion. Current concentrator supports O2 requirement of 5L They would prefer [facility] transport. Lucila Maine will be home all day, and daughter in law will be home until 2pm.  We discussed home support. Son will pursue Medicaid PCS through PCP. They are agreeable to palliative service, progression into home hospice as needed. Discussed providers, and they would like [facility]. Message left with Chrislyn, liaison w [facility].

## 2019-11-05 NOTE — Progress Notes (Signed)
Patient refused BiPAP. Education provided to patient about the consequences of not wearing BiPAP. Patient stated she " does not want that on her face".

## 2019-11-05 NOTE — Progress Notes (Signed)
   NAME:  Hunter Harvey, MRN:  [id number], DOB:  [birth year], LOS: 5 ADMISSION DATE:  10/31/2019, CONSULTATION DATE: 5/31 REFERRING MD: Dr. Juleen China, CHIEF COMPLAINT: Shortness of breath  Brief History   79 year old male with interstitial lung disease admitted 5/31 with acute on chronic hypoxemic and hypercarbic respiratory failure requiring intubation.   History of present illness   Patient is encephalopathic and/or intubated. Therefore history has been obtained from chart review.  79 year old male with PMH significant for DM, interstitial lung disease, rheumatoid arthritis, hearing loss, and recurrent pulmonary infections. He wears 2L of oxygen at home. He was in his usual state of health until 5/31 when he developed progressive shortness of breath and ultimately altered sensorium, which prompted his presentation to [facility] ED. Immediately he was found to be hypoxemic and was escalated to 15L NRB with improvement of his O2 sats. He was eventually initiated on BiPAP. Imaging showed diffuse lung parenchymal abnormality. Laboratory evaluation significant for Na 125, K 7.5 (temporized in ED with improvement to 5.5), Glucose 221, Creatinine 1.5, WBC 13.5 and Hgb 10.7.  Discharge arrangements made yesterday but was not picked up by family had social work involved today Oxygen supplementation arranged Bronchodilators with albuterol  Significant Hospital Events   5/31 admit  Consults:    Procedures:  ETT 5/31  Significant Diagnostic Tests:  HRCT chest 5/31 > honeycombing, subpleural fibrosis craniocaudal gradient, nodular densities scattered with air bronchograms in the bases  Micro Data:  Blood 5/31 >ng Tracheal aspirate 5/31 >ng   Antimicrobials:  Ceftriaxone 5/31-6/4 Azithromycin 5/31 -6/4  Interim history/subjective:   Elderly gentleman Appears comfortable Denies any pain or discomfort Has a cough with minimal expectoration  Objective   Blood pressure (!) 146/88, pulse 98,  temperature 98.4 F (36.9 C), resp. rate 19, height 5\' 7"  (1.702 m), weight 89.4 kg, SpO2 100 %.        Intake/Output Summary (Last 24 hours) at 11/05/2019 1248 Last data filed at 11/05/2019 1000 Gross per 24 hour  Intake 250 ml  Output 450 ml  Net -200 ml   Filed Weights   11/02/19 0500 11/03/19 0425 11/04/19 0516  Weight: 90.3 kg 88.9 kg 89.4 kg    Examination: General: Elderly, on 5 L nasal cannula  HENT: Moist oral mucosa Lungs: Bibasal rales Cardiovascular: S1-S2 appreciated    IgG slightly elevated, IgM and IgA WNL. Beta-2 microglobulin slightly elevated at 2.9.   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  History of recurrent pulmonary infections Acute worsening consistent with possible infectious process-this is improving Add Mucinex for cough To complete Omnicef at home  Acute kidney injury Metabolic derangements-resolving   DM  -SSI -Resume usual medications  Patient is DNR Follow-up as outpatient  I believe the oxygen requirement at present is close to his baseline   Discharge home today Prescription sent in for albuterol nebulization He also has a discharge prescription for 01/04/20 with daughter at bedside prior to discharge Should take it easy New prescription for Xanax for anxiety-should be used sparingly-risks discussed with patient's daughter  [employer], MD [place] [facility] Pager: [phone]  Discharge home today

## 2019-11-05 NOTE — Progress Notes (Signed)
SATURATION QUALIFICATIONS: (This note is used to comply with regulatory documentation for home oxygen)  Patient Saturations on Room Air at Rest = 86%  Patient Saturations on Room Air while Ambulating = 71%  Patient Saturations on 5 Liters of oxygen while Ambulating = 90%  Please briefly explain why patient needs home oxygen: Hunter Harvey was on 2L of O2 at home but with this admission he has been requiring a minimum of 5L to maintain a saturation of >85%

## 2019-11-07 ENCOUNTER — Telehealth: Payer: Self-pay | Admitting: Internal Medicine

## 2019-11-07 ENCOUNTER — Encounter: Payer: Self-pay | Admitting: Adult Health

## 2019-11-07 NOTE — Telephone Encounter (Signed)
Phone call placed to patient to offer to schedule a visit with [facility]. Phone rang, with no answer I left a voicemail for call back. 

## 2019-11-09 NOTE — Discharge Summary (Signed)
Refer to discharge summary performed 11/04/2019  1457 hours

## 2019-11-18 ENCOUNTER — Inpatient Hospital Stay: Payer: Medicare Other | Admitting: Adult Health

## 2019-11-21 ENCOUNTER — Telehealth: Payer: Self-pay | Admitting: Internal Medicine

## 2019-11-21 NOTE — Telephone Encounter (Signed)
Called patient's son, Ignacia Bayley, to schedule the Palliative Consult, no answer - left message with reason for call along with my name and contact number, requesting a call back.

## 2019-11-25 ENCOUNTER — Inpatient Hospital Stay: Payer: Medicare Other | Admitting: Adult Health

## 2019-12-01 DEATH — deceased

## 2021-03-31 IMAGING — CT CT CHEST W/O CM
2 of 4 series · 15 of 36 positions shown, 18 images · non-contrast
Comparison: Film from earlier in the same day.

CLINICAL DATA: Follow-up diffuse airspace opacities

EXAM:
CT CHEST WITHOUT CONTRAST
TECHNIQUE: Multidetector CT imaging of the chest was performed following the
standard protocol without IV contrast.

[Series 3: chest wo · axial · 0.79mm/px · z∈[+1280,+1508]mm · 12 of 136 slices shown, 15 images]
[im 11/136  mediastinal]
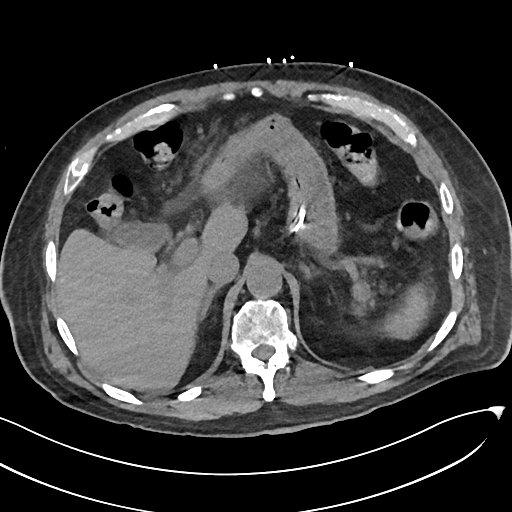
[im 11/136  lung]
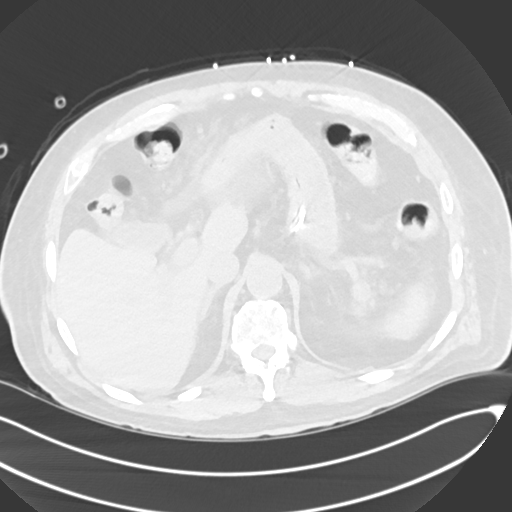
[im 21/136  lung]
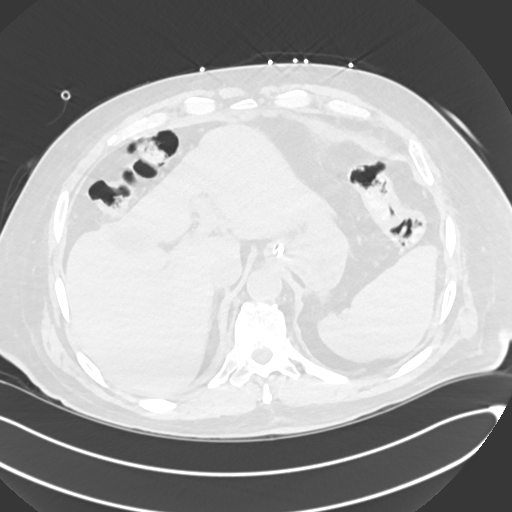
[im 32/136  lung]
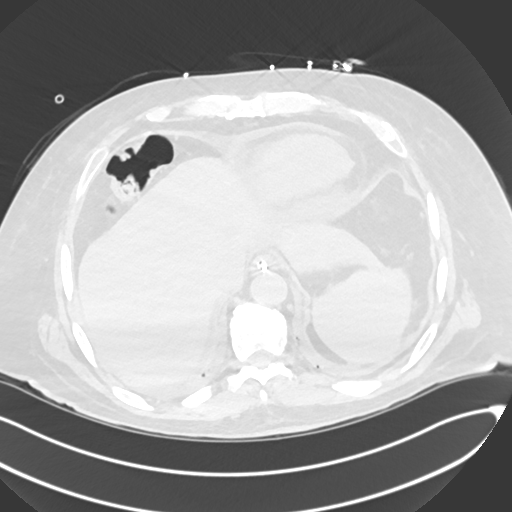
[im 42/136  lung]
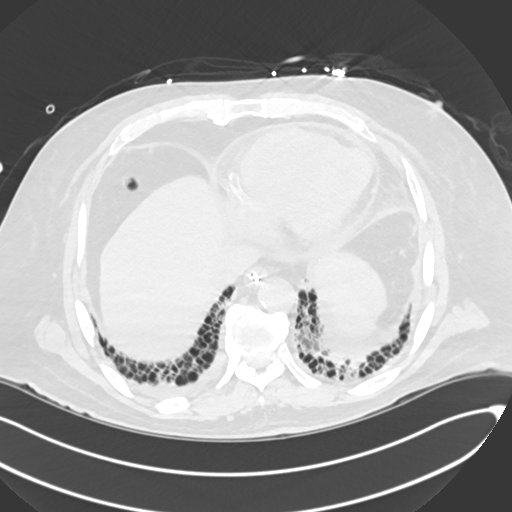
[im 52/136  mediastinal]
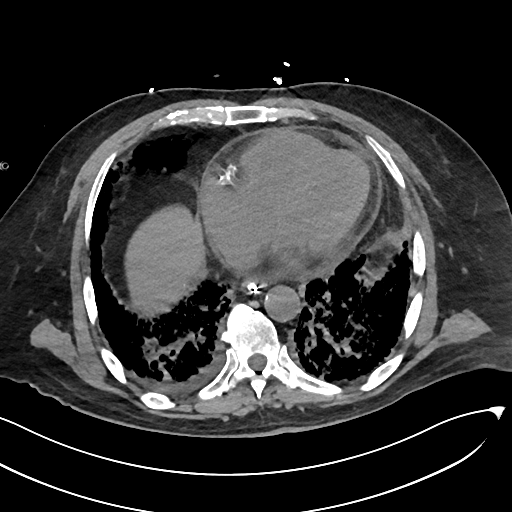
[im 52/136  lung]
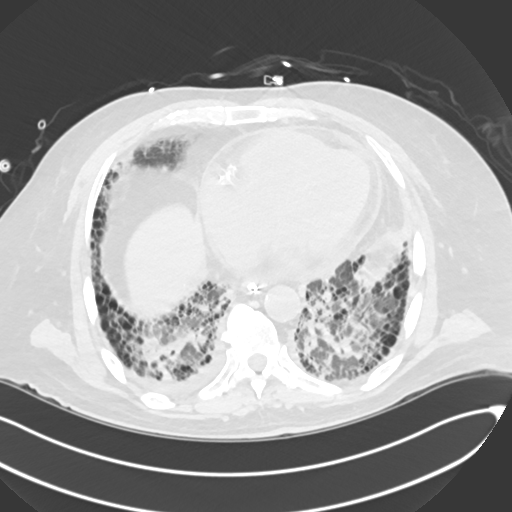
[im 63/136  lung]
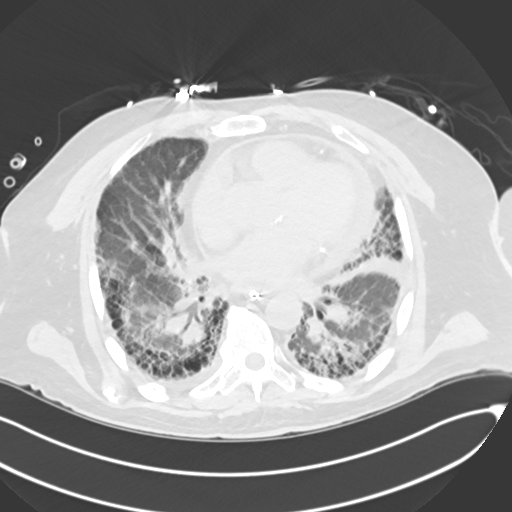
[im 73/136  lung]
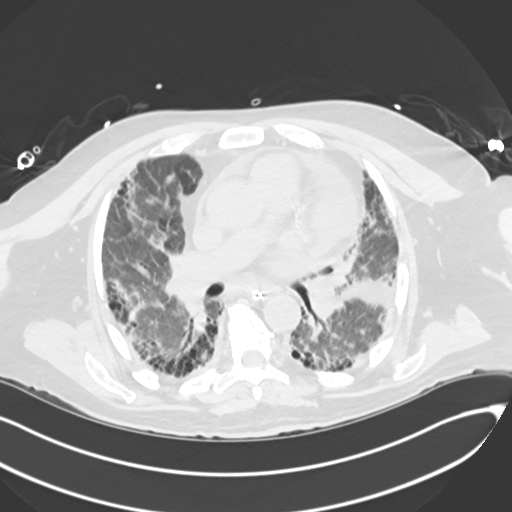
[im 84/136  lung]
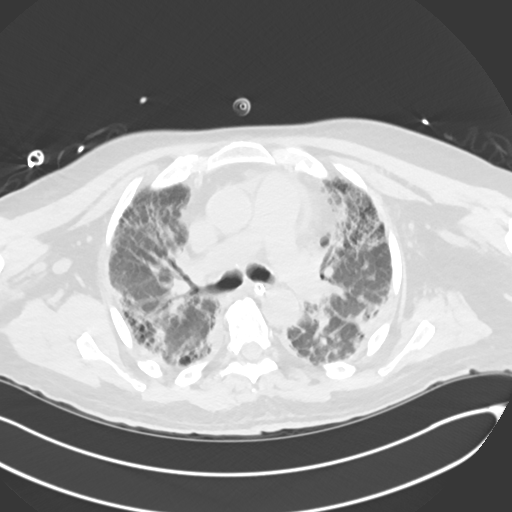
[im 94/136  mediastinal]
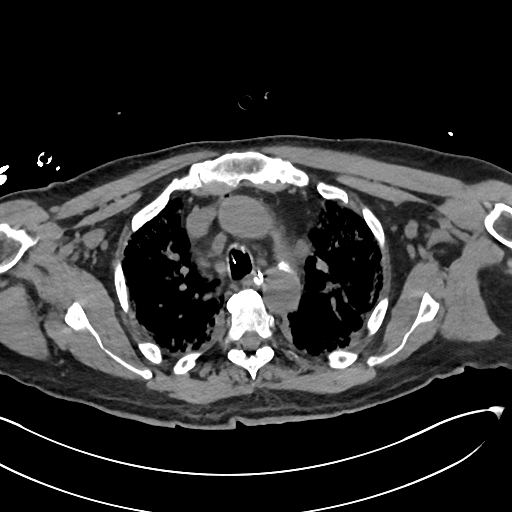
[im 94/136  lung]
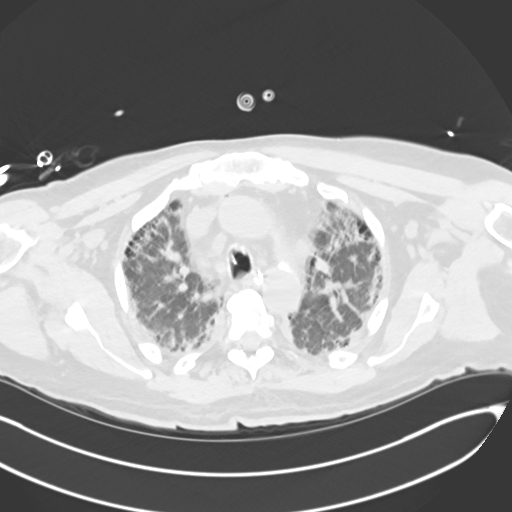
[im 104/136  lung]
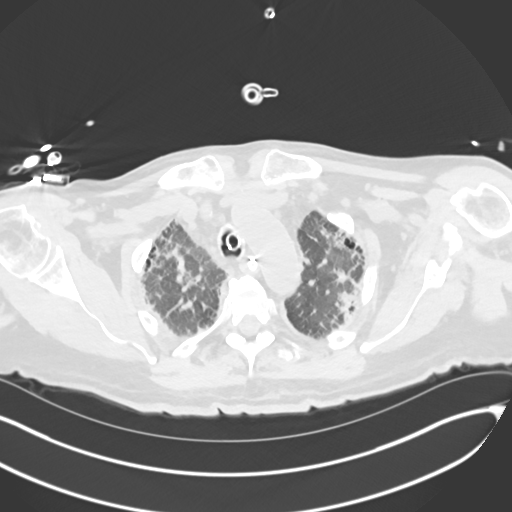
[im 115/136  lung]
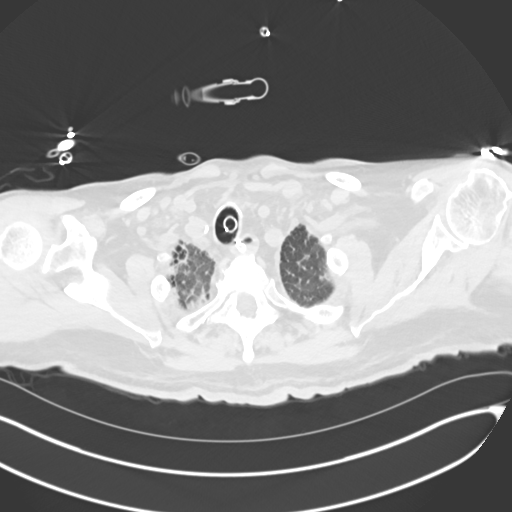
[im 125/136  lung]
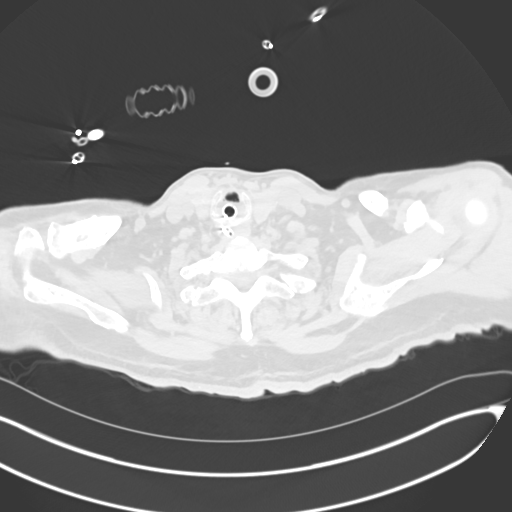

[Series 6: cor · coronal · 0.59mm/px · 3 of 150 slices shown]
[im 30/150  lung]
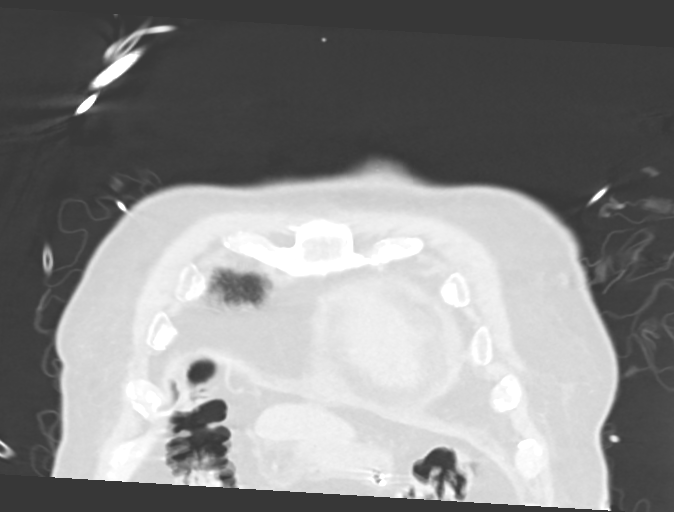
[im 60/150  lung]
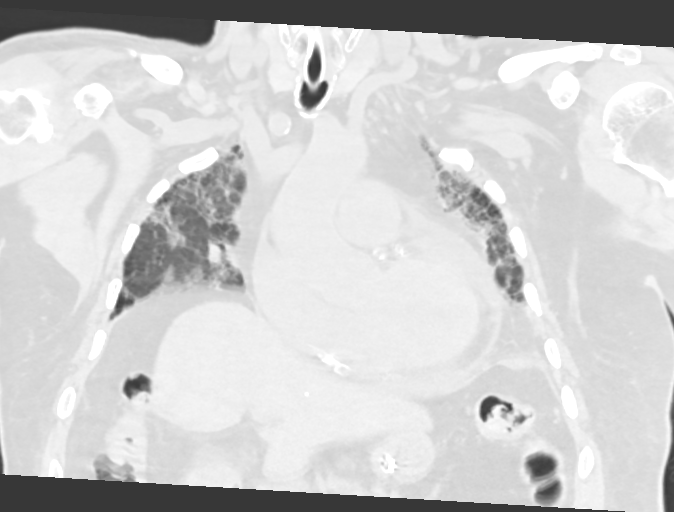
[im 90/150  lung]
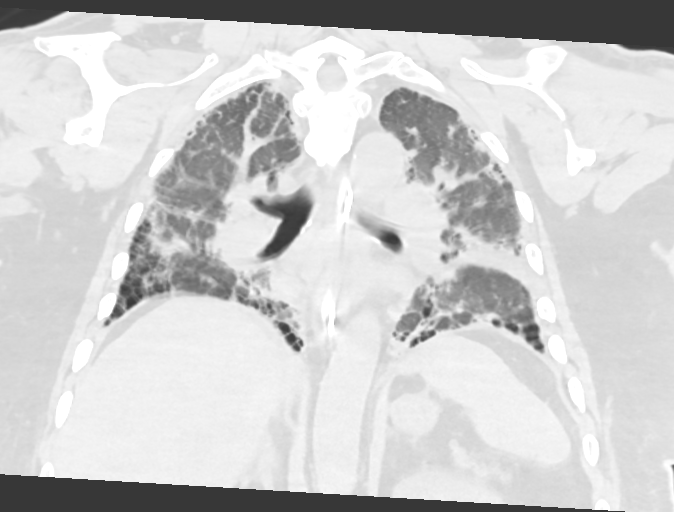

[15 of 36 positions shown; findings below may reference images not displayed]

FINDINGS: Cardiovascular: Coronary calcifications are noted. Aortic
calcifications are seen without aneurysmal dilatation. Small
pericardial effusion is noted. No significant cardiac enlargement is
noted. Prominent central pulmonary vasculature is noted.

Mediastinum/Nodes: Thoracic inlet is within normal limits. Scattered
small mediastinal and hilar lymph nodes are noted. None of these are
significant size criteria. The esophagus is within normal limits.
Gastric catheter extends into the stomach. Endotracheal tube is
noted in the distal trachea.

Lungs/Pleura: Diffuse subpleural fibrosis and bleb formation is
noted bilaterally. This accounts for the majority of the abnormal
density seen on recent chest x-ray. Small bilateral pleural
effusions are noted. Scattered nodular densities are identified
particularly in the apices bilaterally likely related to
postinflammatory change. Mild air bronchograms and infiltrate are
seen in the bases bilaterally. This likely represents acute on
chronic infiltrate.

Upper Abdomen: Large right renal cyst is noted. No other focal
abnormality is noted in the upper abdomen.

Musculoskeletal: Degenerative changes of the thoracic spine are
noted. No acute bony abnormality is seen.
IMPRESSION: Diffuse chronic fibrotic changes and emphysematous change is noted.

Small bilateral pleural effusions are seen.

Patchy somewhat nodular densities are identified bilaterally likely
postinflammatory in nature although short-term follow-up is
recommended in 3-6 weeks. Mild infiltrate is noted in the bases
bilaterally.

Small pericardial effusion.

Aortic Atherosclerosis ([facility]-KY3.3) and Emphysema ([id number].M).

## 2021-04-01 IMAGING — DX DG CHEST 1V PORT
1 series · 1 of 1 positions shown · non-contrast
Comparison: CT 10/31/2019.  Chest x-ray 10/31/2019.

CLINICAL DATA: Hypoxia.

EXAM:
PORTABLE CHEST 1 VIEW

[chest ap]
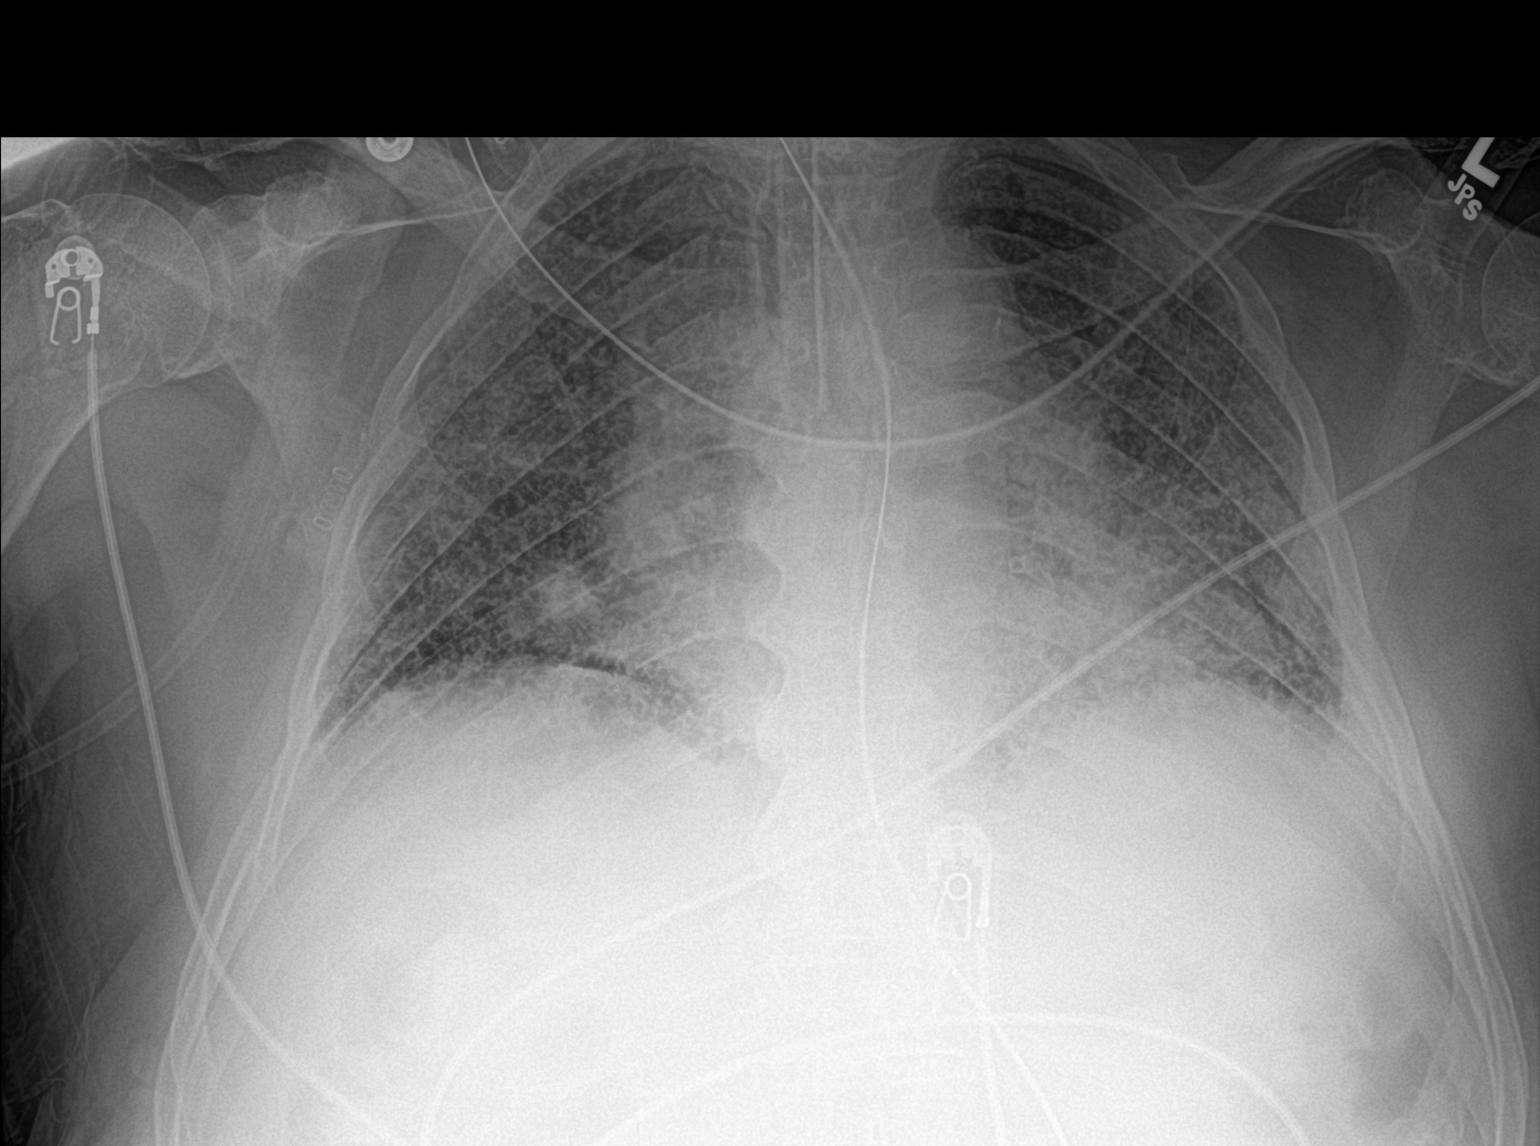

[1 of 1 positions shown; findings below may reference images not displayed]

FINDINGS: Endotracheal tube tip 9 mm above the lower most portion of the
carina. Proximal repositioning of 2 cm suggested. Stable
cardiomegaly. Diffuse bilateral interstitial prominence again noted,
slightly improved from prior exam. No pleural effusion or
pneumothorax.
IMPRESSION: 1. Endotracheal tube tip 9 mm above the lower most portion of the
carina. Proximal repositioning of 2 cm suggested. NG tube in stable
position.

2.  Stable cardiomegaly.

3. Diffuse bilateral interstitial prominence again noted, slightly
improved from prior exam.

## 2021-04-04 IMAGING — DX DG CHEST 1V PORT
1 series · 1 of 1 positions shown · non-contrast
Comparison: Chest x-ray 11/02/2019

CLINICAL DATA: Acute respiratory failure

EXAM:
PORTABLE CHEST 1 VIEW

[chest ap]
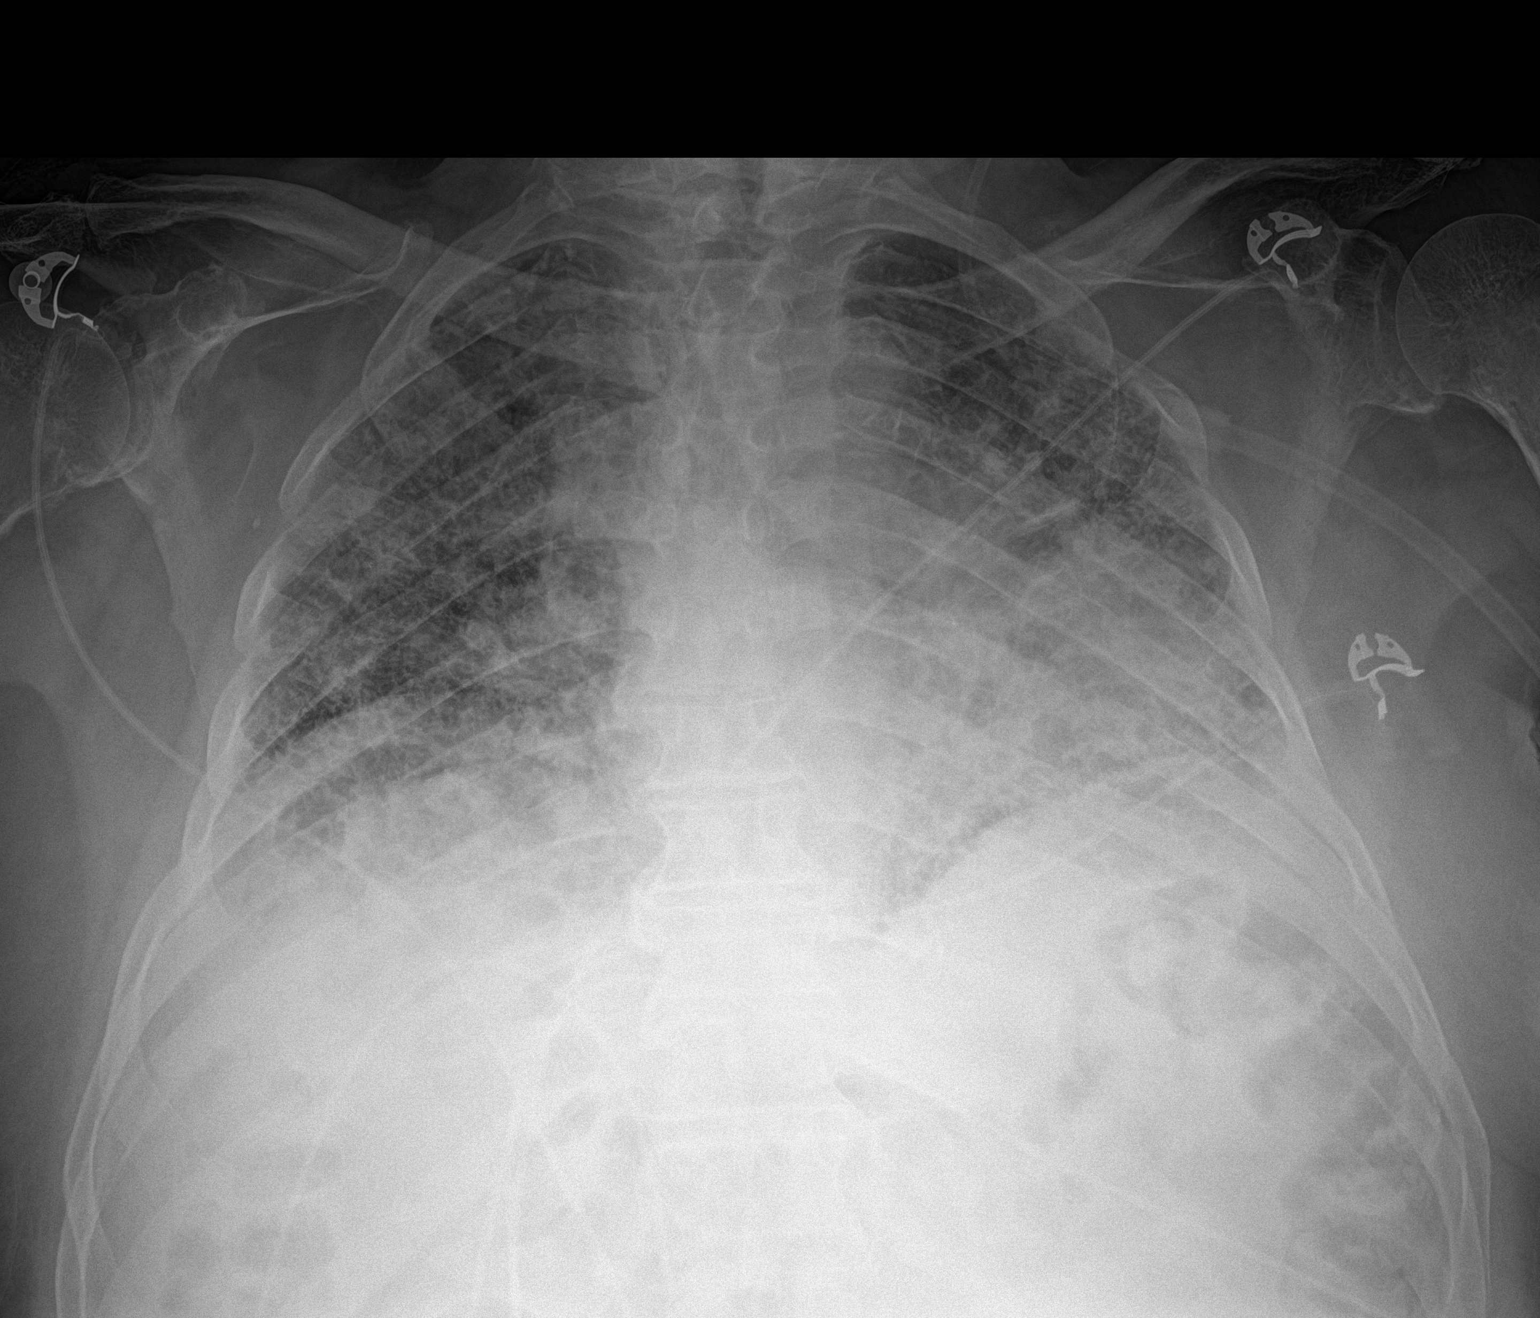

[1 of 1 positions shown; findings below may reference images not displayed]

FINDINGS: The endotracheal tube and NG tubes have been removed. Persistent
diffuse interstitial and airspace process in the lungs with
underlying chronic interstitial disease. No focal airspace
consolidation or pleural effusions. No pneumothorax.
IMPRESSION: Removal of the ET and NG tubes.

Persistent diffuse interstitial and airspace process superimposed on
chronic lung disease.
